# Patient Record
Sex: Male | Born: 1955
Health system: Southern US, Community
[De-identification: ages and names within clinical notes are randomized; demographics above are authoritative.]

## PROBLEM LIST (undated history)

## (undated) DIAGNOSIS — E119 Type 2 diabetes mellitus without complications: Secondary | ICD-10-CM

## (undated) DIAGNOSIS — M170 Bilateral primary osteoarthritis of knee: Secondary | ICD-10-CM

## (undated) DIAGNOSIS — E785 Hyperlipidemia, unspecified: Secondary | ICD-10-CM

## (undated) DIAGNOSIS — I10 Essential (primary) hypertension: Secondary | ICD-10-CM

## (undated) HISTORY — DX: Bilateral primary osteoarthritis of knee: M17.0

## (undated) HISTORY — DX: Essential (primary) hypertension: I10

## (undated) HISTORY — DX: Hyperlipidemia, unspecified: E78.5

## (undated) HISTORY — PX: KNEE SURGERY: SHX244

## (undated) HISTORY — PX: COLONOSCOPY: SHX5424

## (undated) HISTORY — DX: Type 2 diabetes mellitus without complications: E11.9

---

## 2001-04-06 ENCOUNTER — Inpatient Hospital Stay (HOSPITAL_COMMUNITY): Admission: EM | Admit: 2001-04-06 | Discharge: 2001-04-07 | Payer: Self-pay | Admitting: Emergency Medicine

## 2001-04-06 ENCOUNTER — Encounter: Payer: Self-pay | Admitting: Emergency Medicine

## 2013-09-26 ENCOUNTER — Encounter: Payer: Self-pay | Admitting: Sports Medicine

## 2013-09-26 ENCOUNTER — Ambulatory Visit (INDEPENDENT_AMBULATORY_CARE_PROVIDER_SITE_OTHER): Payer: BC Managed Care – PPO | Admitting: Sports Medicine

## 2013-09-26 VITALS — BP 123/83 | HR 76 | Ht 70.0 in | Wt 203.0 lb

## 2013-09-26 DIAGNOSIS — M1710 Unilateral primary osteoarthritis, unspecified knee: Secondary | ICD-10-CM

## 2013-09-26 DIAGNOSIS — M170 Bilateral primary osteoarthritis of knee: Secondary | ICD-10-CM

## 2013-09-26 DIAGNOSIS — IMO0002 Reserved for concepts with insufficient information to code with codable children: Secondary | ICD-10-CM

## 2013-09-26 HISTORY — DX: Bilateral primary osteoarthritis of knee: M17.0

## 2013-09-26 NOTE — Assessment & Plan Note (Signed)
We placed him in green soles insoles with arch support to help give stability below the knee to help decrease stress in the medial joint line. In addition they made recommendations for hip abduction strengthening as well as calf strengthening exercises. He'll use the body helix compression sleeve on the left. We're waiting the availability of the open patella compression sleeves for his right knee. Anti-inflammatories as needed. Followup as needed. Have recommended exercise bike or bicycle for exercise.

## 2013-09-26 NOTE — Progress Notes (Signed)
Patient ID: Ricardo Mccormick, Ricardo Mccormick   DOB: Feb 13, 1956, 58 y.o.   MRN: [id number] 58 year old Ricardo Mccormick presents with complaint of right knee pain for approximately one year. Pain is worse when walking. He's walk approximately 2 miles now he is limited to about a half mile before he has pain. Describes the pain on the anterolateral and superior portions of his knee. No specific injury or inciting event. He does have mild pain and discomfort the left knee as well however, he is a remote knee reconstruction of that knee. He is taking intermittent ibuprofen with only minimal relief.  Pertinent past medical history: Knee reconstruction in 1980 or 1981, hypertension  Social history: Nonsmoker,  Occasional alcohol  Review of systems as per history of present illness otherwise all systems negative  Examination: BP 123/83  Pulse 76  Ht 5\' 10"  (1.778 m)  Wt 203 lb (92.08 kg)  BMI 29.13 kg/m2 Well-developed well-nourished 58 year old Ricardo Mccormick awake alert and oriented in no acute distress ATNC, EOMI Normal respiratory effort  Knees: Mild effusion bilaterally with surgical scar on the left knee. Palpation normal with no warmth or joint line tenderness or patellar tenderness or condyle tenderness. ROM normal in flexion and extension and lower leg rotation. Ligaments with no laxity to varus or valgus stress Negative Mcmurray's and provocative meniscal tests. Pain reproduced with patellar compression  right greater than left Positive Clarks test right greater than left Patellar and quadriceps tendons unremarkable. Hamstring and quadriceps strength is normal.  Muscle skeletal ultrasound of the right knee reveals a suprapatellar pouch effusion.  Evidence of a degenerative meniscus medially.  Loss of articular cartilage in the medial portion of the patellofemoral groove.  Bone spurs and joint space narrowing in the medial joint line.

## 2013-11-07 ENCOUNTER — Ambulatory Visit: Payer: BC Managed Care – PPO | Admitting: Sports Medicine

## 2013-12-04 ENCOUNTER — Encounter: Payer: Self-pay | Admitting: Sports Medicine

## 2013-12-04 ENCOUNTER — Ambulatory Visit (INDEPENDENT_AMBULATORY_CARE_PROVIDER_SITE_OTHER): Payer: BC Managed Care – PPO | Admitting: Sports Medicine

## 2013-12-04 VITALS — BP 108/78 | HR 78 | Ht 70.0 in | Wt 200.0 lb

## 2013-12-04 DIAGNOSIS — IMO0002 Reserved for concepts with insufficient information to code with codable children: Secondary | ICD-10-CM

## 2013-12-04 DIAGNOSIS — M170 Bilateral primary osteoarthritis of knee: Secondary | ICD-10-CM

## 2013-12-04 DIAGNOSIS — M1710 Unilateral primary osteoarthritis, unspecified knee: Secondary | ICD-10-CM

## 2013-12-04 NOTE — Assessment & Plan Note (Signed)
Trial w open patella compression sleeve for walking  Lateral heel wedge on RT  Use insoles in shoes  Start bike rehab program  Cont Ibuprofen  Reck 3 mos

## 2013-12-04 NOTE — Progress Notes (Signed)
Patient ID: Ricardo Mccormick, Ricardo Mccormick   DOB: 03-27-1956, 58 y.o.   MRN: [id number]  Patient returns for F/U of bilat knee DJD Insoles helped Knee pain on RT begins within 800 yds of walking Now able to go about 1 mile Still with moderate swelling  Has not tried compression sleeve yet He gets some relief if he takes 600 ibuprofen  Has not started biking program yet  PEXAM NAD, muscular Ricardo Mccormick BP 108/78  Pulse 78  Ht 5\' 10"  (1.778 m)  Wt 200 lb (90.719 kg)  BMI 28.70 kg/m2   RT Severe DJD changes on RT with spurring along medial border Some TTP along sup lat border of patella Crepitation of patella Extension almost full Flex to 130 deg but pain at 120  LT knee Large surg scar  Chronic DJD as noted before  Gait shows rapid valgus thrust on RT  Correction w compression sleeve and lateral wedge helps lessen this

## 2013-12-11 ENCOUNTER — Ambulatory Visit: Payer: BC Managed Care – PPO | Admitting: Sports Medicine

## 2014-01-15 ENCOUNTER — Ambulatory Visit: Payer: BC Managed Care – PPO | Admitting: Sports Medicine

## 2014-04-21 ENCOUNTER — Ambulatory Visit
Admission: RE | Admit: 2014-04-21 | Discharge: 2014-04-21 | Disposition: A | Discharge status: Home / Self Care | Payer: BC Managed Care – PPO | Source: Ambulatory Visit | Attending: Family Medicine | Admitting: Family Medicine

## 2014-04-21 ENCOUNTER — Other Ambulatory Visit: Payer: Self-pay | Admitting: Family Medicine

## 2014-04-21 DIAGNOSIS — M2550 Pain in unspecified joint: Secondary | ICD-10-CM

## 2015-07-10 ENCOUNTER — Ambulatory Visit (INDEPENDENT_AMBULATORY_CARE_PROVIDER_SITE_OTHER): Payer: BLUE CROSS/BLUE SHIELD | Admitting: Cardiology

## 2015-07-10 ENCOUNTER — Ambulatory Visit (INDEPENDENT_AMBULATORY_CARE_PROVIDER_SITE_OTHER)
Admission: RE | Admit: 2015-07-10 | Discharge: 2015-07-10 | Disposition: A | Discharge status: Home / Self Care | Payer: Self-pay | Source: Ambulatory Visit | Attending: Cardiovascular Disease | Admitting: Cardiovascular Disease

## 2015-07-10 VITALS — BP 114/68 | HR 98 | Ht 70.0 in | Wt 216.2 lb

## 2015-07-10 DIAGNOSIS — E785 Hyperlipidemia, unspecified: Secondary | ICD-10-CM | POA: Diagnosis not present

## 2015-07-10 DIAGNOSIS — R079 Chest pain, unspecified: Secondary | ICD-10-CM

## 2015-07-10 DIAGNOSIS — R0602 Shortness of breath: Secondary | ICD-10-CM

## 2015-07-10 DIAGNOSIS — I10 Essential (primary) hypertension: Secondary | ICD-10-CM | POA: Diagnosis not present

## 2015-07-10 DIAGNOSIS — Z8249 Family history of ischemic heart disease and other diseases of the circulatory system: Secondary | ICD-10-CM

## 2015-07-10 DIAGNOSIS — I2510 Atherosclerotic heart disease of native coronary artery without angina pectoris: Secondary | ICD-10-CM

## 2015-07-10 NOTE — Addendum Note (Signed)
Addended by: Burnetta Sabin on: 07/10/2015 01:52 PM   Modules accepted: Orders

## 2015-07-10 NOTE — Progress Notes (Addendum)
Cardiology Office Note  NEW PATIENT   Date:  07/10/2015   ID:  Ricardo Mccormick, DOB [birth year], MRN [id number]  PCP:  Darrow Bussing, MD  Cardiologist:  NEW Dr. Eden Emms    Chief Complaint  Patient presents with  . Chest Pain  . Shortness of Breath      History of Present Illness: Ricardo Mccormick is a 60 y.o. male who presents for  Chest heaviness.  Pt with hx of  HTN, DM hyperlipidemia and family hx premature CAD- with father MI at 52,  with  was seen 07/07/15 for cough cold and chest tightness and placed on steroids.  With the chest pain troponin was neg. And EKG SR and no acute changes.  Pt placed on claritin and mucinex. Referred to cardiology.     Today pain has resolved-only was present the first day.  Denies any further chest pain or SOB.  No palpitations.  Also being evaluated for Lupus by [facility] Rheumatology Dr. Lucienne Capers.     Past Medical History  Diagnosis Date  . Arthritis of both knees 09/26/2013    Patellofemoral chondromalacia and medial joint line arthritis of the right knee. Posttraumatic arthritis of the left knee   . HTN (hypertension)   . HLD (hyperlipidemia)   . Diabetes mellitus without complication [facility])     Past Surgical History  Procedure Laterality Date  . Colonoscopy    . Knee surgery Left     1980 &1992     Current Outpatient Prescriptions  Medication Sig Dispense Refill  . Ascorbic Acid (VITAMIN C) 1000 MG tablet Take 1,000 mg by mouth daily.    Marland Kitchen aspirin 81 MG tablet Take 81 mg by mouth daily.    Marland Kitchen atorvastatin (LIPITOR) 40 MG tablet Take 40 mg by mouth daily.    Marland Kitchen dextromethorphan-guaiFENesin (MUCINEX DM) 30-600 MG 12hr tablet Take 1 tablet by mouth 2 (two) times daily.    Marland Kitchen lisinopril (PRINIVIL,ZESTRIL) 10 MG tablet Take 10 mg by mouth daily.    . Loratadine-Pseudoephedrine (CLARITIN-D 24 HOUR PO) Take 1 tablet by mouth daily.    . Omega-3 Fatty Acids (FISH OIL) 1200 MG CAPS Take 1 capsule by mouth daily.     Marland Kitchen omeprazole (PRILOSEC) 20 MG  capsule Take 20 mg by mouth. One tab every third day    . predniSONE (DELTASONE) 20 MG tablet Take 20 mg by mouth as directed.  0   No current facility-administered medications for this visit.    Allergies:   Review of patient's allergies indicates no known allergies.    Social History:  The patient  reports that he has never smoked. He does not have any smokeless tobacco history on file. He reports that he drinks about 0.6 oz of alcohol per week.   Family History:  The patient's family history includes Alzheimer's disease in his mother; Cancer in his mother; Diabetes in his maternal grandfather and sister; Epilepsy in his sister; Heart attack in his father.    ROS:  General:no colds or fevers, no weight changes Skin:no rashes or ulcers HEENT:no blurred vision, no congestion CV:see HPI PUL:see HPI GI:no diarrhea constipation or melena, no indigestion GU:no hematuria, no dysuria MS:no joint pain, no claudication Neuro:no syncope, no lightheadedness Endo:no diabetes, no thyroid disease  Wt Readings from Last 3 Encounters:  07/10/15 216 lb 3.2 oz (98.068 kg)  12/04/13 200 lb (90.719 kg)  09/26/13 203 lb (92.08 kg)     PHYSICAL EXAM: VS:  BP 114/68 mmHg  Pulse 98  Ht  (1.778 m)  Wt 216 lb 3.2 oz (98.068 kg)  BMI 31.02 kg/m2 , BMI Body mass index is 31.02 kg/(m^2). General:Pleasant affect, NAD Skin:Warm and dry, brisk capillary refill HEENT:normocephalic, sclera clear, mucus membranes moist Neck:supple, no JVD, no bruits  Heart:S1S2 RRR without murmur, gallup, rub or click Lungs:clear without rales, rhonchi, or wheezes ZOX:WRUE, non tender, + BS, do not palpate liver spleen or masses Ext:no lower ext edema, 2+ pedal pulses, 2+ radial pulses Neuro:alert and oriented, MAE, follows commands, + facial symmetry    EKG:  EKG is ordered today. The ekg ordered today demonstrates SR possible Lt atrial enlargement.  No changes.    Recent Labs:07/07/15 Na140, K+ 4.3 LFTs  were normal Cr 1.29  Bun 16 CPK 109 troponin<0.01  Lipid Panel 04/29/15  tg 127, tCHOL 147 hdl 45  LDL 76  Other studies Reviewed: Additional studies/ records that were reviewed today include: previous office notes. .   ASSESSMENT AND PLAN:  1.  Chest pain with cold symptoms now resolved.  Pt asymptomatic but with multiple risk factors should have eval.  I discussed with Dr. Eden Emms and then with pt plan to do cardiac CT calcium score.  This will give 5 year risk for cardiac disease.  He will then follow with Dr. Eden Emms for results and any further recommendations.    2. HTN  Controlled  3. Hyperlipidemia treated  With LDL 76  4. Premature family hx CAD.  ADDENDUM:  PLEASE NOT CA+SCORE WAS 1070.  DR. Eden Emms READ IT AND WE WILL PROCEED WITH EXERCISE MYOVIEW.  I CALLED THE PT  AND GAVE THE RESULTS AND THAT HE DID HAVE 3 VESSEL CAD.  PT AWARE AND WILL PROCEED WITH THE TEST.   Current medicines are reviewed with the patient today.  The patient Has no concerns regarding medicines.  The following changes have been made:  See above Labs/ tests ordered today include:see above  Disposition:   FU:  see above  Nyoka Lint, NP  07/10/2015 8:13 AM    [facility] [address] [place], [place], [place]  [address]/ [address] [place], [place] Phone: [phone]; Fax: [phone]  [phone]

## 2015-07-10 NOTE — Patient Instructions (Addendum)
Medication Instructions:  Your physician recommends that you continue on your current medications as directed. Please refer to the Current Medication list given to you today.   Labwork: None ordered  Testing/Procedures: Your physician has requested that you have cardiac CT with Calcium Scoring.  Cardiac computed tomography (CT) is a painless test that uses an x-ray machine to take clear, detailed pictures of your heart. For further information please visit [email or web address] Please follow instruction sheet as given.     Follow-Up: Your physician recommends that you schedule a follow-up appointment in: 1ST AVAILABLE WITH DR. Eden Emms   Any Other Special Instructions Will Be Listed Below (If Applicable). Coronary Calcium Scan A coronary calcium scan is an imaging test used to look for deposits of calcium and other fatty materials (plaques) in the inner lining of the blood vessels of your heart (coronary arteries). These deposits of calcium and plaques can partly clog and narrow the coronary arteries without producing any symptoms or warning signs. This puts you at risk for a heart attack. This test can detect these deposits before symptoms develop.  LET [facility] CARE PROVIDER KNOW ABOUT:  Any allergies you have.  All medicines you are taking, including vitamins, herbs, eye drops, creams, and over-the-counter medicines.  Previous problems you or members of your family have had with the use of anesthetics.  Any blood disorders you have.  Previous surgeries you have had.  Medical conditions you have.  Possibility of pregnancy, if this applies. RISKS AND COMPLICATIONS Generally, this is a safe procedure. However, as with any procedure, complications can occur. This test involves the use of radiation. Radiation exposure can be dangerous to a pregnant woman and her unborn baby. If you are pregnant, you should not have this procedure done.  BEFORE THE PROCEDURE There is no special  preparation for the procedure. PROCEDURE  You will need to undress and put on a hospital gown. You will need to remove any jewelry around your neck or chest.  Sticky electrodes are placed on your chest and are connected to an electrocardiogram (EKG or electrocardiography) machine to recorda tracing of the electrical activity of your heart.  A CT scanner will take pictures of your heart. During this time, you will be asked to lie still and hold your breath for 2-3 seconds while a picture is being taken of your heart. AFTER THE PROCEDURE   You will be allowed to get dressed.  You can return to your normal activities after the scan is done.   This information is not intended to replace advice given to you by your health care provider. Make sure you discuss any questions you have with your health care provider.   Document Released: 10/29/2007 Document Revised: 05/07/2013 Document Reviewed: 01/07/2013 Elsevier Interactive Patient Education [employer]     If you need a refill on your cardiac medications before your next appointment, please call your pharmacy.

## 2015-07-20 ENCOUNTER — Telehealth (HOSPITAL_COMMUNITY): Payer: Self-pay | Admitting: *Deleted

## 2015-07-20 NOTE — Telephone Encounter (Signed)
Patient given detailed instructions per Myocardial Perfusion Study Information Sheet for the test on 07/22/15. Patient notified to arrive 15 minutes early and that it is imperative to arrive on time for appointment to keep from having the test rescheduled.  If you need to cancel or reschedule your appointment, please call the office within 24 hours of your appointment. Failure to do so may result in a cancellation of your appointment, and a $50 no show fee. Patient verbalized understanding. Danie Hannig J Toddrick Sanna, RN  

## 2015-07-22 ENCOUNTER — Ambulatory Visit (HOSPITAL_COMMUNITY): Payer: BLUE CROSS/BLUE SHIELD | Attending: Cardiology

## 2015-07-22 DIAGNOSIS — R079 Chest pain, unspecified: Secondary | ICD-10-CM | POA: Diagnosis not present

## 2015-07-22 DIAGNOSIS — R0602 Shortness of breath: Secondary | ICD-10-CM | POA: Insufficient documentation

## 2015-07-22 DIAGNOSIS — I10 Essential (primary) hypertension: Secondary | ICD-10-CM | POA: Diagnosis not present

## 2015-07-22 DIAGNOSIS — I2510 Atherosclerotic heart disease of native coronary artery without angina pectoris: Secondary | ICD-10-CM

## 2015-07-22 LAB — MYOCARDIAL PERFUSION IMAGING
Estimated workload: 8.5 METS
Exercise duration (min): 7 min
Exercise duration (sec): 0 s
LV dias vol: 104 mL (ref 62–150)
LV sys vol: 35 mL
MPHR: 162 {beats}/min
Peak HR: 151 {beats}/min
Percent HR: 93 %
RATE: 0.36
RPE: 18
Rest HR: 74 {beats}/min
SDS: 1
SRS: 0
SSS: 1
TID: 0.79

## 2015-07-22 MED ORDER — TECHNETIUM TC 99M SESTAMIBI GENERIC - CARDIOLITE
32.5000 | Freq: Once | INTRAVENOUS | Status: AC | PRN
  Administered 2015-07-22: 33 via INTRAVENOUS

## 2015-07-22 MED ORDER — TECHNETIUM TC 99M SESTAMIBI GENERIC - CARDIOLITE
10.3000 | Freq: Once | INTRAVENOUS | Status: AC | PRN
  Administered 2015-07-22: 10 via INTRAVENOUS

## 2015-07-23 NOTE — Progress Notes (Signed)
Patient ID: Joncarlo Friberg, male   DOB: Sep 24, 1955, 60 y.o.   MRN: [id number]    Cardiology Office Note  NEW PATIENT   Date:  07/28/2015   ID:  Virgina Evener, DOB 1956-05-07, MRN [id number]  PCP:  Darrow Bussing, MD  Cardiologist:  NEW Dr. Eden Emms    Chief Complaint  Patient presents with  . Shortness of Breath    no curent SOB sx      History of Present Illness: Dominick Morella is a 60 y.o. male who was seen by PA 2/27  for  Chest heaviness.  Pt with hx of  HTN, DM hyperlipidemia and family hx premature CAD- with father MI at 45,  with  was seen 07/07/15 for cough cold and chest tightness and placed on steroids.  With the chest pain troponin was neg. And EKG SR and no acute changes.  Pt placed on claritin and mucinex. Referred to cardiology.      Denies any further chest pain or SOB.  No palpitations.  Also being evaluated for Lupus by [facility] Rheumatology Dr. Lucienne Capers.    07/10/15 Calcium score 1070  With calcium in all 3 major arteries   F/U perfusion study done 07/22/15 normal with no ischemia EF 60%   He is taking aspirin and statin   No chest pain.  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   Past Medical History  Diagnosis Date  . Arthritis of both knees 09/26/2013    Patellofemoral chondromalacia and medial joint line arthritis of the right knee. Posttraumatic arthritis of the left knee   . HTN (hypertension)   . HLD (hyperlipidemia)   . Diabetes mellitus without complication [facility])     Past Surgical History  Procedure Laterality Date  . Colonoscopy    . Knee surgery Left     1980 &1992     Current Outpatient Prescriptions  Medication Sig Dispense Refill  . Ascorbic Acid (VITAMIN C) 1000 MG tablet Take 1,000 mg by mouth daily.    Marland Kitchen aspirin 81 MG tablet Take 81 mg by mouth daily.    Marland Kitchen atorvastatin (LIPITOR) 40 MG tablet Take 40 mg by mouth daily.    . hydroxychloroquine (PLAQUENIL) 200 MG tablet Take 1 tablet by mouth daily.  1  . lisinopril (PRINIVIL,ZESTRIL) 10  MG tablet Take 10 mg by mouth daily.    . Omega-3 Fatty Acids (FISH OIL) 1200 MG CAPS Take 1 capsule by mouth daily.     Marland Kitchen omeprazole (PRILOSEC) 20 MG capsule Take 20 mg by mouth every 3 (three) days.     . predniSONE (DELTASONE) 20 MG tablet Take 20 mg by mouth as directed.  0   No current facility-administered medications for this visit.    Allergies:   Review of patient's allergies indicates no known allergies.    Social History:  The patient  reports that he has never smoked. He does not have any smokeless tobacco history on file. He reports that he drinks about 0.6 oz of alcohol per week.   Family History:  The patient's family history includes Alzheimer's disease in his mother; Cancer in his mother; Diabetes in his maternal grandfather and sister; Epilepsy in his sister; Heart attack in his father.    ROS:  General:no colds or fevers, no weight changes Skin:no rashes or ulcers HEENT:no blurred vision, no congestion CV:see HPI PUL:see HPI GI:no diarrhea constipation or melena, no indigestion GU:no hematuria, no dysuria MS:no joint pain, no claudication Neuro:no syncope, no lightheadedness Endo:no diabetes, no  thyroid disease  Wt Readings from Last 3 Encounters:  07/28/15 98.34 kg (216 lb 12.8 oz)  07/22/15 97.977 kg (216 lb)  07/10/15 98.068 kg (216 lb 3.2 oz)     PHYSICAL EXAM: VS:  BP 132/84 mmHg  Pulse 97  Ht 5\' 10"  (1.778 m)  Wt 98.34 kg (216 lb 12.8 oz)  BMI 31.11 kg/m2  SpO2 95% , BMI Body mass index is 31.11 kg/(m^2). General:Pleasant affect, NAD Skin:Warm and dry, brisk capillary refill HEENT:normocephalic, sclera clear, mucus membranes moist Neck:supple, no JVD, no bruits  Heart:S1S2 RRR without murmur, gallup, rub or click Lungs:clear without rales, rhonchi, or wheezes NUU:VOZDAbd:soft, non tender, + BS, do not palpate liver spleen or masses Ext:no lower ext edema, 2+ pedal pulses, 2+ radial pulses Neuro:alert and oriented, MAE, follows commands, + facial  symmetry    EKG:    07/10/15  SR possible Lt atrial enlargement.  No changes.    Recent Labs:07/07/15 Na140, K+ 4.3 LFTs were normal Cr 1.29  Bun 16 CPK 109 troponin<0.01  Lipid Panel 04/29/15  tg 127, tCHOL 147 hdl 45  LDL 76  Other studies Reviewed: Additional studies/ records that were reviewed today include: previous office notes. .   ASSESSMENT AND PLAN:  1.  Chest pain with cold symptoms now resolved.   Normal perfusion study.  High calcium score on statin and asa  2. HTN  Controlled  3. Hyperlipidemia treated  With LDL 76 continue lipitor   4. Premature family hx CAD.  Will need ETT in 2 years     Charlton Hawseter Dalaysia Harms

## 2015-07-24 ENCOUNTER — Encounter: Payer: Self-pay | Admitting: Cardiovascular Disease

## 2015-07-28 ENCOUNTER — Ambulatory Visit (INDEPENDENT_AMBULATORY_CARE_PROVIDER_SITE_OTHER): Payer: BLUE CROSS/BLUE SHIELD | Admitting: Cardiovascular Disease

## 2015-07-28 ENCOUNTER — Encounter: Payer: Self-pay | Admitting: Cardiovascular Disease

## 2015-07-28 VITALS — BP 132/84 | HR 97 | Ht 70.0 in | Wt 216.8 lb

## 2015-07-28 DIAGNOSIS — R0602 Shortness of breath: Secondary | ICD-10-CM

## 2015-07-28 NOTE — Patient Instructions (Addendum)

## 2015-09-30 DIAGNOSIS — N183 Chronic kidney disease, stage 3 (moderate): Secondary | ICD-10-CM | POA: Diagnosis not present

## 2015-09-30 DIAGNOSIS — R7989 Other specified abnormal findings of blood chemistry: Secondary | ICD-10-CM | POA: Diagnosis not present

## 2015-09-30 DIAGNOSIS — M064 Inflammatory polyarthropathy: Secondary | ICD-10-CM | POA: Diagnosis not present

## 2015-09-30 DIAGNOSIS — R768 Other specified abnormal immunological findings in serum: Secondary | ICD-10-CM | POA: Diagnosis not present

## 2015-10-05 DIAGNOSIS — H524 Presbyopia: Secondary | ICD-10-CM | POA: Diagnosis not present

## 2015-10-05 DIAGNOSIS — Z1283 Encounter for screening for malignant neoplasm of skin: Secondary | ICD-10-CM | POA: Diagnosis not present

## 2015-10-05 DIAGNOSIS — Z809 Family history of malignant neoplasm, unspecified: Secondary | ICD-10-CM | POA: Diagnosis not present

## 2015-10-05 DIAGNOSIS — L821 Other seborrheic keratosis: Secondary | ICD-10-CM | POA: Diagnosis not present

## 2015-10-05 DIAGNOSIS — L570 Actinic keratosis: Secondary | ICD-10-CM | POA: Diagnosis not present

## 2015-10-28 DIAGNOSIS — M25522 Pain in left elbow: Secondary | ICD-10-CM | POA: Diagnosis not present

## 2015-10-28 DIAGNOSIS — I10 Essential (primary) hypertension: Secondary | ICD-10-CM | POA: Diagnosis not present

## 2015-10-28 DIAGNOSIS — E119 Type 2 diabetes mellitus without complications: Secondary | ICD-10-CM | POA: Diagnosis not present

## 2015-10-28 DIAGNOSIS — M25561 Pain in right knee: Secondary | ICD-10-CM | POA: Diagnosis not present

## 2015-12-31 DIAGNOSIS — M064 Inflammatory polyarthropathy: Secondary | ICD-10-CM | POA: Diagnosis not present

## 2015-12-31 DIAGNOSIS — R768 Other specified abnormal immunological findings in serum: Secondary | ICD-10-CM | POA: Diagnosis not present

## 2015-12-31 DIAGNOSIS — R7989 Other specified abnormal findings of blood chemistry: Secondary | ICD-10-CM | POA: Diagnosis not present

## 2015-12-31 DIAGNOSIS — N183 Chronic kidney disease, stage 3 (moderate): Secondary | ICD-10-CM | POA: Diagnosis not present

## 2016-02-12 DIAGNOSIS — M170 Bilateral primary osteoarthritis of knee: Secondary | ICD-10-CM | POA: Diagnosis not present

## 2016-02-12 DIAGNOSIS — M1712 Unilateral primary osteoarthritis, left knee: Secondary | ICD-10-CM | POA: Diagnosis not present

## 2016-02-12 DIAGNOSIS — M1711 Unilateral primary osteoarthritis, right knee: Secondary | ICD-10-CM | POA: Diagnosis not present

## 2016-03-31 DIAGNOSIS — M25562 Pain in left knee: Secondary | ICD-10-CM | POA: Diagnosis not present

## 2016-03-31 DIAGNOSIS — M170 Bilateral primary osteoarthritis of knee: Secondary | ICD-10-CM | POA: Diagnosis not present

## 2016-03-31 DIAGNOSIS — G8929 Other chronic pain: Secondary | ICD-10-CM | POA: Diagnosis not present

## 2016-04-04 DIAGNOSIS — R768 Other specified abnormal immunological findings in serum: Secondary | ICD-10-CM | POA: Diagnosis not present

## 2016-04-04 DIAGNOSIS — N183 Chronic kidney disease, stage 3 (moderate): Secondary | ICD-10-CM | POA: Diagnosis not present

## 2016-04-04 DIAGNOSIS — M064 Inflammatory polyarthropathy: Secondary | ICD-10-CM | POA: Diagnosis not present

## 2016-04-04 DIAGNOSIS — R7989 Other specified abnormal findings of blood chemistry: Secondary | ICD-10-CM | POA: Diagnosis not present

## 2016-04-25 DIAGNOSIS — I10 Essential (primary) hypertension: Secondary | ICD-10-CM | POA: Diagnosis not present

## 2016-04-25 DIAGNOSIS — Z0000 Encounter for general adult medical examination without abnormal findings: Secondary | ICD-10-CM | POA: Diagnosis not present

## 2016-04-25 DIAGNOSIS — E119 Type 2 diabetes mellitus without complications: Secondary | ICD-10-CM | POA: Diagnosis not present

## 2016-04-25 DIAGNOSIS — Z125 Encounter for screening for malignant neoplasm of prostate: Secondary | ICD-10-CM | POA: Diagnosis not present

## 2016-04-25 DIAGNOSIS — E785 Hyperlipidemia, unspecified: Secondary | ICD-10-CM | POA: Diagnosis not present

## 2016-06-12 ENCOUNTER — Encounter (HOSPITAL_BASED_OUTPATIENT_CLINIC_OR_DEPARTMENT_OTHER): Payer: Self-pay | Admitting: Emergency Medicine

## 2016-06-12 ENCOUNTER — Emergency Department (HOSPITAL_BASED_OUTPATIENT_CLINIC_OR_DEPARTMENT_OTHER)
Admission: EM | Admit: 2016-06-12 | Discharge: 2016-06-12 | Disposition: A | Discharge status: Home / Self Care | Payer: BLUE CROSS/BLUE SHIELD | Attending: Emergency Medicine | Admitting: Emergency Medicine

## 2016-06-12 DIAGNOSIS — Z7982 Long term (current) use of aspirin: Secondary | ICD-10-CM | POA: Insufficient documentation

## 2016-06-12 DIAGNOSIS — E119 Type 2 diabetes mellitus without complications: Secondary | ICD-10-CM | POA: Insufficient documentation

## 2016-06-12 DIAGNOSIS — M5442 Lumbago with sciatica, left side: Secondary | ICD-10-CM | POA: Diagnosis not present

## 2016-06-12 DIAGNOSIS — I10 Essential (primary) hypertension: Secondary | ICD-10-CM | POA: Diagnosis not present

## 2016-06-12 DIAGNOSIS — M545 Low back pain: Secondary | ICD-10-CM | POA: Diagnosis present

## 2016-06-12 MED ORDER — IBUPROFEN 800 MG PO TABS: 800.0000 mg | ORAL_TABLET | Freq: Four times a day (QID) | ORAL | 0 refills | Status: AC

## 2016-06-12 NOTE — ED Triage Notes (Signed)
Patient states that he rolled over in bed  = reports that he has had pain to his left hip and down his leg since. Denies any trauma. The [facility] and was sent her for r/oDVT  - No swelling noted.

## 2016-06-12 NOTE — ED Provider Notes (Signed)
[facility] Provider Note   CSN: [id number] Arrival date & time: 06/12/16  1250     History   Chief Complaint Chief Complaint  Patient presents with  . Back Pain    HPI Ricardo Mccormick is a 61 y.o. male.  The history is provided by the patient.  Back Pain   This is a new problem. Episode onset: 4 days ago. The problem occurs constantly. The problem has not changed since onset.The pain is associated with twisting. The pain is present in the lumbar spine. The quality of the pain is described as stabbing and aching. The pain radiates to the left thigh. The pain is moderate. The symptoms are aggravated by certain positions. The pain is worse during the day. Pertinent negatives include no fever, no bowel incontinence, no bladder incontinence and no weakness. He has tried nothing for the symptoms. Risk factors include obesity and lack of exercise.    Past Medical History:  Diagnosis Date  . Arthritis of both knees 09/26/2013   Patellofemoral chondromalacia and medial joint line arthritis of the right knee. Posttraumatic arthritis of the left knee   . Diabetes mellitus without complication (HCC)   . HLD (hyperlipidemia)   . HTN (hypertension)     Patient Active Problem List   Diagnosis Date Noted  . Arthritis of both knees 09/26/2013    Past Surgical History:  Procedure Laterality Date  . COLONOSCOPY    . KNEE SURGERY Left    1980 &1992       Home Medications    Prior to Admission medications   Medication Sig Start Date End Date Taking? Authorizing Provider  Ascorbic Acid (VITAMIN C) 1000 MG tablet Take 1,000 mg by mouth daily.    Historical Provider, MD  aspirin 81 MG tablet Take 81 mg by mouth daily.    Historical Provider, MD  atorvastatin (LIPITOR) 40 MG tablet Take 40 mg by mouth daily.    Historical Provider, MD  hydroxychloroquine (PLAQUENIL) 200 MG tablet Take 1 tablet by mouth daily. 07/22/15   Historical Provider, MD  lisinopril (PRINIVIL,ZESTRIL) 10 MG  tablet Take 10 mg by mouth daily.    Historical Provider, MD  Omega-3 Fatty Acids (FISH OIL) 1200 MG CAPS Take 1 capsule by mouth daily.     Historical Provider, MD  omeprazole (PRILOSEC) 20 MG capsule Take 20 mg by mouth every 3 (three) days.     Historical Provider, MD  predniSONE (DELTASONE) 20 MG tablet Take 20 mg by mouth as directed. 07/07/15   Historical Provider, MD    Family History Family History  Problem Relation Age of Onset  . Heart attack Father   . Cancer Mother     skin,breast  . Alzheimer's disease Mother   . Epilepsy Sister   . Diabetes Sister   . Diabetes Maternal Grandfather     Social History Social History  Substance Use Topics  . Smoking status: Never Smoker  . Smokeless tobacco: Never Used  . Alcohol use 0.6 oz/week    1 Cans of beer per week     Allergies   Patient has no known allergies.   Review of Systems Review of Systems  Constitutional: Negative for fever.  Gastrointestinal: Negative for bowel incontinence.  Genitourinary: Negative for bladder incontinence.  Musculoskeletal: Positive for back pain.  Neurological: Negative for weakness.  All other systems reviewed and are negative.    Physical Exam Updated Vital Signs BP 128/94 (BP Location: Right Arm)   Pulse 99  Temp 98.6 F (37 C) (Oral)   Resp 18   Ht 5\' 10"  (1.778 m)   Wt 223 lb (101.2 kg)   SpO2 97%   BMI 32.00 kg/m   Physical Exam  Constitutional: He is oriented to person, place, and time. He appears well-developed and well-nourished. No distress.  HENT:  Head: Normocephalic and atraumatic.  Nose: Nose normal.  Eyes: Conjunctivae are normal.  Neck: Neck supple. No tracheal deviation present.  Cardiovascular: Normal rate and regular rhythm.   Pulmonary/Chest: Effort normal. No respiratory distress.  Abdominal: Soft. He exhibits no distension.  Musculoskeletal:       Lumbar back: He exhibits pain. He exhibits normal range of motion and no tenderness.        Back:  Left leg varicose veins, soft and compressible. No leg swelling. Well healed anterior knee scar. Positive leg raise on left.  Neurological: He is alert and oriented to person, place, and time.  Skin: Skin is warm and dry.  Psychiatric: He has a normal mood and affect.     ED Treatments / Results  Labs (all labs ordered are listed, but only abnormal results are displayed) Labs Reviewed - No data to display  EKG  EKG Interpretation None       Radiology No results found.  Procedures Procedures (including critical care time)  Medications Ordered in ED Medications - No data to display   Initial Impression / Assessment and Plan / ED Course  I have reviewed the triage vital signs and the nursing notes.  Pertinent labs & imaging results that were available during my care of the patient were reviewed by me and considered in my medical decision making (see chart for details).     61 y.o. male presents with back pain in lumbar area since 3 days ago with signs of radicular pain. No acute traumatic onset. No red flag symptoms of fever, weight loss, saddle anesthesia, weakness, fecal/urinary incontinence or urinary retention. Suspect MSK etiology. No swelling, Wells score for DVT is 0 and low clinical suspicion. Has diffuse varicose veins that are soft, compressible and resolved when Pt is seated. He states these have been here for years since his remote knee surgery on that side. No indication for imaging emergently. Patient was recommended to take short course of scheduled NSAIDs and engage in early mobility as definitive treatment. Return precautions discussed for worsening or new concerning symptoms.    Final Clinical Impressions(s) / ED Diagnoses   Final diagnoses:  Acute left-sided low back pain with left-sided sciatica    New Prescriptions Discharge Medication List as of 06/12/2016  3:33 PM       Lyndal Pulley, MD 06/13/16 [phone]

## 2016-06-12 NOTE — ED Notes (Signed)
Patient states he rolled over in bed on Thursday night and began having sudden onset left side low back pain, with pain that intermittently radiates to the knee, the pain is sometimes on his inner leg and sometimes on the outer leg. Patient is A&Ox4. Patient denies any injuries. Patient states the pain is currently 7/10, treated with ibuprofen at home. Patient [facility] and was sent here to rule out a DVT. Patient denies any SOB, denies difficulty breathing. Left calf is equal to right calf in size, but is slightly warmed to touch.

## 2016-06-17 DIAGNOSIS — M5432 Sciatica, left side: Secondary | ICD-10-CM | POA: Diagnosis not present

## 2016-07-01 ENCOUNTER — Ambulatory Visit (INDEPENDENT_AMBULATORY_CARE_PROVIDER_SITE_OTHER): Payer: BLUE CROSS/BLUE SHIELD | Admitting: Physical Therapy

## 2016-07-01 ENCOUNTER — Encounter: Payer: Self-pay | Admitting: Physical Therapy

## 2016-07-01 DIAGNOSIS — M62838 Other muscle spasm: Secondary | ICD-10-CM

## 2016-07-01 DIAGNOSIS — M6281 Muscle weakness (generalized): Secondary | ICD-10-CM | POA: Diagnosis not present

## 2016-07-01 DIAGNOSIS — M5442 Lumbago with sciatica, left side: Secondary | ICD-10-CM

## 2016-07-01 NOTE — Patient Instructions (Addendum)
   Use a firm ball and perform trigger point release to the left piriformis ( buttock) -lie on back, ball under buttocks, find tender spot and rest 30-45 sec  Quads / HF, Prone    Lie face down, knees together. Grasp one ankle with same-side hand. Use towel if needed to reach. Gently pull foot toward buttock. Hold _45__ seconds. Repeat _1__ times per session. Do _1__ sessions per day. Repeat on the other leg    Piriformis Stretch, Supine - can use towel, strap under right knee to help pull toward body.     Lie supine, left ankle crossed onto opposite knee. Holding bottom leg behind knee, gently pull legs toward chest until stretch is felt in buttock of top leg. Hold _45__ seconds. For deeper stretch gently push top knee away from body. Repeat __1_ times per session. Do ___ sessions per day.  Abdominal Bracing With Pelvic Floor (Hook-Lying) - can have hands in lower belly to feel for contraction, buzzing helps also.    With neutral spine, tighten pelvic floor and abdominals. Hold 5 seconds. Repeat __10_ times. Do _1__ times a day.  Hip External Rotation With Pillow: Transverse Plane Stability   One knee bent, one leg straight, on pillow. Slowly roll bent knee out. Be sure pelvis does not rotate. Do _10__ times. Restabilize pelvis. Repeat with other leg. Do _1-2__ sets, _1__ times per day.  [facility] [address] [place], [place] [address]  [phone] (office) [phone] (fax)

## 2016-07-01 NOTE — Therapy (Signed)
[facility] [place] [address] [place], [place], [address] Phone: [phone]   Fax:  [phone]  Physical Therapy Evaluation  Patient Details  Name: Ricardo Mccormick MRN: [id number] Date of Birth: [birth year] Referring Provider: Dr Carilyn Goodpasture Docia Chuck  Encounter Date: 07/01/2016      PT End of Session - 07/01/16 0807    Visit Number 1   Number of Visits 8   Date for PT Re-Evaluation 07/29/16   PT Start Time 0807   PT Stop Time 0853   PT Time Calculation (min) 46 min      Past Medical History:  Diagnosis Date  . Arthritis of both knees 09/26/2013   Patellofemoral chondromalacia and medial joint line arthritis of the right knee. Posttraumatic arthritis of the left knee   . Diabetes mellitus without complication (HCC)   . HLD (hyperlipidemia)   . HTN (hypertension)     Past Surgical History:  Procedure Laterality Date  . COLONOSCOPY    . KNEE SURGERY Left    1980 &1992    There were no vitals filed for this visit.       Subjective Assessment - 07/01/16 0810    Subjective Pt reports he rolled over in bed and had burning in the Lt low back and buttocks around 06/08/16.  Then two days later he had pain down the Lt LE. He reports having stiff back before however nothing like this, will see chiropracter and they would fix it, this time they told him to see MD for further assessment.    Pertinent History going to schedule bilat knee TKA sometime this year.    How long can you sit comfortably? varies 15-20 minutes   How long can you walk comfortably? 15-20 minutes   Diagnostic tests x-rays (-)    Patient Stated Goals get back to normal and get rid of pain   Currently in Pain? Yes   Pain Score 5   in AM 9/10   Pain Location Back   Pain Orientation Left   Pain Descriptors / Indicators Aching;Sharp;Shooting;Sore;Throbbing   Pain Type Acute pain   Pain Onset 1 to 4 weeks ago   Pain Frequency Constant   Aggravating Factors   random    Pain Relieving Factors change positions until he has relief            [facility] PT Assessment - 07/01/16 0001      Assessment   Medical Diagnosis Lt sided sciatica   Referring Provider Dr Carilyn Goodpasture Koirala   Onset Date/Surgical Date 06/08/16   Hand Dominance Right   Next MD Visit not scheduled   Prior Therapy not PT, only chiropracter     Precautions   Precaution Comments no heavy lifitng or pushing     Balance Screen   Has the patient fallen in the past 6 months No     Home Environment   Living Environment Private residence   Living Arrangements Spouse/significant other   Home Layout Two level  descend one stair at a time - baseline since knee surgery     Prior Function   Level of Independence Independent   Vocation Full time employment   [employer], mainly desk work   Leisure walk, some stetching     Functional Tests   Functional tests Squat;Single leg stance     Squat   Comments slight shift to Rt      Single Leg Stance   Comments bilat WNL     Posture/Postural  Control   Posture/Postural Control Postural limitations   Postural Limitations Decreased lumbar lordosis     ROM / Strength   AROM / PROM / Strength AROM;Strength     AROM   AROM Assessment Site Lumbar   Lumbar Flexion 2" from floor ( stomach stops him)    Lumbar Extension decreased 25% with Lt radicular pain   Lumbar - Right Side Bend WNl   Lumbar - Left Side Bend WNL   Lumbar - Right Rotation WNL   Lumbar - Left Rotation WNl     Strength   Strength Assessment Site Hip;Knee;Ankle;Lumbar   Right/Left Hip Right;Left   Right Hip Flexion 5/5   Right Hip Extension --  5-/5   Right Hip ABduction 5/5   Left Hip Flexion 5/5   Left Hip Extension --  5-/5   Left Hip ABduction 5/5   Right/Left Knee --  Rt WNL, Lt flex 5-/5, ext 4/5   Right/Left Ankle --  WNL except Lt PF & DF 4+/5   Lumbar Flexion --  TA poor   Lumbar Extension --  multifidi good      Flexibility    Soft Tissue Assessment /Muscle Length yes   Hamstrings supine SLR Lt 85, Rt 90   Quadriceps prone knee flex Lt 122, Rt 126     Palpation   Spinal mobility pain and hypomobility Lt L4 UPA articular pillar and transverse process   Palpation comment tight and tender in Lt piriformis                   [facility] Adult PT Treatment/Exercise - 07/01/16 0001      Self-Care   Self-Care Other Self-Care Comments   Other Self-Care Comments  trigger point release Lt piriformis with ball      Exercises   Exercises Lumbar     Lumbar Exercises: Stretches   Quad Stretch 1 rep  prone knee flex with strap   Piriformis Stretch 1 rep  45 sec for Lt      Lumbar Exercises: Supine   Ab Set 10 reps;5 seconds   Clam 10 reps  each side with TA contraction                PT Education - 07/01/16 0846    Education provided Yes   Education Details HEP    Person(s) Educated Patient   Methods Explanation;Handout;Demonstration   Comprehension Verbalized understanding;Returned demonstration             PT Long Term Goals - 07/01/16 0958      PT LONG TERM GOAL #1   Title I with advanced HEP (07/29/16)   Time 4   Period Weeks   Status New     PT LONG TERM GOAL #2   Title demo strong TA contraction and good stabilization of the pelvis with core exercise ( 07/29/16)    Time 4   Period Weeks   Status New     PT LONG TERM GOAL #3   Title increase strength Lt hip =/> 5/5 ( 07/29/16)    Time 4   Period Weeks     PT LONG TERM GOAL #4   Title report minimal to no Lt sciatica pain (07/29/16)    Time 4   Period Weeks   Status New               Plan - 07/01/16 16100854    Clinical Impression Statement 61 yo male presents for low complexity PT  eval for Lt sided sciatica.  He has some weakness in the Lt LE, tightness and tenderness in the Lt piriformis as well.  There is some weakness and tightness in the Lt quad.  He is going to be having bilat TKA's this year and needs to  get things resolved so he can heal quickly from this.    Rehab Potential Excellent   PT Frequency 2x / week   PT Duration 4 weeks   PT Treatment/Interventions Moist Heat;Ultrasound;Therapeutic exercise;Therapeutic activities;Dry needling;Manual techniques;Neuromuscular re-education;Cryotherapy;Electrical Stimulation;Iontophoresis 4mg /ml Dexamethasone;Patient/family education   PT Next Visit Plan manual work Lt piriformis, combo, core TEFL teacher and Agree with Plan of Care Patient      Patient will benefit from skilled therapeutic intervention in order to improve the following deficits and impairments:  Pain, Increased muscle spasms, Decreased strength, Impaired flexibility  Visit Diagnosis: Acute left-sided low back pain with left-sided sciatica - Plan: PT plan of care cert/re-cert  Muscle weakness (generalized) - Plan: PT plan of care cert/re-cert  Other muscle spasm - Plan: PT plan of care cert/re-cert     Problem List Patient Active Problem List   Diagnosis Date Noted  . Arthritis of both knees 09/26/2013    Roderic Scarce PT  07/01/2016, 10:03 AM  [facility] [address] [place], [place], [address] Phone: [phone]   Fax:  [phone]  Name: Domenick Quebedeaux MRN: [id number] Date of Birth: 1955/11/16

## 2016-07-05 ENCOUNTER — Ambulatory Visit (INDEPENDENT_AMBULATORY_CARE_PROVIDER_SITE_OTHER): Payer: BLUE CROSS/BLUE SHIELD | Admitting: Physical Therapy

## 2016-07-05 DIAGNOSIS — M5442 Lumbago with sciatica, left side: Secondary | ICD-10-CM | POA: Diagnosis not present

## 2016-07-05 DIAGNOSIS — M6281 Muscle weakness (generalized): Secondary | ICD-10-CM | POA: Diagnosis not present

## 2016-07-05 DIAGNOSIS — M62838 Other muscle spasm: Secondary | ICD-10-CM | POA: Diagnosis not present

## 2016-07-05 NOTE — Therapy (Signed)
[facility] [place] [address] [place], [place], [address] Phone: [phone]   Fax:  [phone]  Physical Therapy Treatment  Patient Details  Name: Ricardo Mccormick MRN: [id number] Date of Birth: January 24, 1956 Referring Provider: Dr. Darrow Bussing  Encounter Date: 07/05/2016      PT End of Session - 07/05/16 0810    Visit Number 2   Number of Visits 8   Date for PT Re-Evaluation 07/29/16   PT Start Time 0804   PT Stop Time 0858   PT Time Calculation (min) 54 min      Past Medical History:  Diagnosis Date  . Arthritis of both knees 09/26/2013   Patellofemoral chondromalacia and medial joint line arthritis of the right knee. Posttraumatic arthritis of the left knee   . Diabetes mellitus without complication (HCC)   . HLD (hyperlipidemia)   . HTN (hypertension)     Past Surgical History:  Procedure Laterality Date  . COLONOSCOPY    . KNEE SURGERY Left    1980 &1992    There were no vitals filed for this visit.      Subjective Assessment - 07/05/16 0810    Subjective Pt reports his symptoms are a little better.  Pain goes to back of Lt knee.  He is still having trouble sleeping through night.   Currently in Pain? Yes   Pain Score 4    Pain Location Back   Pain Orientation Left   Pain Descriptors / Indicators Constant;Dull   Aggravating Factors  sitting, standing or laying too long    Pain Relieving Factors changing positions             [facility] PT Assessment - 07/05/16 0001      Assessment   Medical Diagnosis Lt sided sciatica   Referring Provider Dr. Carilyn Goodpasture Koirala   Onset Date/Surgical Date 06/08/16   Hand Dominance Right   Next MD Visit not scheduled     Flexibility   Quadriceps prone knee flex 125 deg LLE.           [facility] Adult PT Treatment/Exercise - 07/05/16 0001      Lumbar Exercises: Stretches   Passive Hamstring Stretch 2 reps  45 sec, repeated 2 reps LLE in sitting.    Quad Stretch 2  reps;30 seconds  prone with strap, each leg    Piriformis Stretch 2 reps;30 seconds  each leg. Repeated 2 reps in sitting.      Lumbar Exercises: Aerobic   Stationary Bike NuStep L5: 6 min      Lumbar Exercises: Supine   Ab Set 5 reps;5 seconds   Clam 10 reps  each side with TA contraction   Bent Knee Raise 10 reps  each side with TA contraction   Bent Knee Raise Limitations Pt reports some mild buttock pain when lifting LLE.    Bridge 5 reps     Modalities   Modalities Moist Heat;Electrical Stimulation     Moist Heat Therapy   Number Minutes Moist Heat 15 Minutes   Moist Heat Location Lumbar Spine;Hip  Lt     Emergency planning/management officer IFC   Electrical Stimulation Parameters to tolerance   Electrical Stimulation Goals Pain;Tone     Manual Therapy   Manual Therapy Soft tissue mobilization   Manual therapy comments Pt prone   Soft tissue mobilization TPR to Lt piriformis, glute  PT Long Term Goals - 07/05/16 1058      PT LONG TERM GOAL #1   Title I with advanced HEP (07/29/16)   Time 4   Period Weeks   Status On-going     PT LONG TERM GOAL #2   Title demo strong TA contraction and good stabilization of the pelvis with core exercise ( 07/29/16)    Time 4   Period Weeks   Status On-going     PT LONG TERM GOAL #3   Title increase strength Lt hip =/> 5/5 ( 07/29/16)    Time 4   Period Weeks   Status On-going     PT LONG TERM GOAL #4   Title report minimal to no Lt sciatica pain (07/29/16)    Time 4   Period Weeks   Status On-going               Plan - 07/05/16 1052    Clinical Impression Statement Pt demonstrated strong TA contraction with minimal cues.  He was point tender in Lt glut med, piriformis with manual therapy.  Pain reduced with stretches and further reduction with use of estim/MHP at end of session.     Rehab Potential Excellent    PT Frequency 2x / week   PT Duration 4 weeks   PT Treatment/Interventions Moist Heat;Ultrasound;Therapeutic exercise;Therapeutic activities;Dry needling;Manual techniques;Neuromuscular re-education;Cryotherapy;Electrical Stimulation;Iontophoresis 4mg /ml Dexamethasone;Patient/family education   PT Next Visit Plan manual work Lt piriformis, core strengthening; modalities as indicated.    Consulted and Agree with Plan of Care Patient      Patient will benefit from skilled therapeutic intervention in order to improve the following deficits and impairments:  Pain, Increased muscle spasms, Decreased strength, Impaired flexibility  Visit Diagnosis: Acute left-sided low back pain with left-sided sciatica  Muscle weakness (generalized)  Other muscle spasm     Problem List Patient Active Problem List   Diagnosis Date Noted  . Arthritis of both knees 09/26/2013   Mayer CamelJennifer Carlson-Long, PTA 07/05/16 10:58 AM  [facility] [place] [facility] [address] [place], [place], [address] Phone: [phone]   Fax:  [phone]  Name: Ricardo Mccormick MRN: [id number] Date of Birth: 06-09-1955

## 2016-07-07 DIAGNOSIS — R7989 Other specified abnormal findings of blood chemistry: Secondary | ICD-10-CM | POA: Diagnosis not present

## 2016-07-07 DIAGNOSIS — M064 Inflammatory polyarthropathy: Secondary | ICD-10-CM | POA: Diagnosis not present

## 2016-07-07 DIAGNOSIS — N183 Chronic kidney disease, stage 3 (moderate): Secondary | ICD-10-CM | POA: Diagnosis not present

## 2016-07-07 DIAGNOSIS — R768 Other specified abnormal immunological findings in serum: Secondary | ICD-10-CM | POA: Diagnosis not present

## 2016-07-08 ENCOUNTER — Ambulatory Visit (INDEPENDENT_AMBULATORY_CARE_PROVIDER_SITE_OTHER): Payer: BLUE CROSS/BLUE SHIELD | Admitting: Physical Therapy

## 2016-07-08 DIAGNOSIS — M5442 Lumbago with sciatica, left side: Secondary | ICD-10-CM | POA: Diagnosis not present

## 2016-07-08 DIAGNOSIS — M62838 Other muscle spasm: Secondary | ICD-10-CM

## 2016-07-08 DIAGNOSIS — M6281 Muscle weakness (generalized): Secondary | ICD-10-CM

## 2016-07-08 NOTE — Patient Instructions (Addendum)
Arm / Leg Lift: Opposite (Prone)    Lift left leg and opposite arm ___2_ inches from floor, keeping knee locked. Repeat _10___ times per set. Do __2_ sets per session. Do __3__ sessions per week.   http://orth.[email or web address].us/[email or web address]    Outer Hip Stretch: Reclined IT Band Stretch (Strap)    Strap around opposite foot, pull across only as far as possible with shoulders on mat.  Keep bottom foot pointed towards ceiling.  You should feel a stretch along outer thigh.  Hold for __30__ sec . Repeat __2__ times each leg.  [facility] [address] [place], [place] [address]  [phone] (office) [phone] (fax)

## 2016-07-08 NOTE — Therapy (Signed)
[facility] [address] [place], [place], [address] Phone: [phone]   Fax:  [phone]  Physical Therapy Treatment  Patient Details  Name: Ricardo EvenerKurt Mccormick MRN: [id number] Date of Birth: November 30, 1955 Referring Provider: Dr. Darrow Bussingibas Koirala  Encounter Date: 07/08/2016      PT End of Session - 07/08/16 1151    Visit Number 3   Number of Visits 8   Date for PT Re-Evaluation 07/29/16   PT Start Time 1147   PT Stop Time 1246   PT Time Calculation (min) 59 min   Activity Tolerance Patient tolerated treatment well;No increased pain   Behavior During Therapy WFL for tasks assessed/performed      Past Medical History:  Diagnosis Date  . Arthritis of both knees 09/26/2013   Patellofemoral chondromalacia and medial joint line arthritis of the right knee. Posttraumatic arthritis of the left knee   . Diabetes mellitus without complication (HCC)   . HLD (hyperlipidemia)   . HTN (hypertension)     Past Surgical History:  Procedure Laterality Date  . COLONOSCOPY    . KNEE SURGERY Left    1980 &1992    There were no vitals filed for this visit.      Subjective Assessment - 07/08/16 1151    Subjective "It still feels tight in the morning (Lt back/buttock)".  He is taking less medicine now, 2 ibprofen in morning and evening.  He continues his stretches daily.    Currently in Pain? Yes   Pain Score 1    Pain Location Back   Pain Orientation Left   Pain Descriptors / Indicators Aching            [facility] PT Assessment - 07/08/16 0001      Assessment   Medical Diagnosis Lt sided sciatica   Onset Date/Surgical Date 06/08/16   Hand Dominance Right   Next MD Visit not scheduled   Prior Therapy not PT, only chiropracter          [facility] Adult PT Treatment/Exercise - 07/08/16 0001      Lumbar Exercises: Stretches   Passive Hamstring Stretch 2 reps  45 sec   Quad Stretch 2 reps;30 seconds  prone with strap, each leg     ITB Stretch 2 reps;30 seconds  2 reps each leg, supine with strap   Piriformis Stretch 2 reps;30 seconds  each leg     Lumbar Exercises: Aerobic   Stationary Bike NuStep L5: 5 min      Lumbar Exercises: Supine   Clam 10 reps  each side with TA contraction   Bent Knee Raise 15 reps  each side with TA contraction   Bridge 10 reps;5 seconds   Bridge Limitations then single leg bridges x 10 eac hleg     Lumbar Exercises: Prone   Straight Leg Raise 10 reps;2 seconds  each side   Opposite Arm/Leg Raise Right arm/Left leg;Left arm/Right leg;10 reps     Modalities   Modalities Moist Heat;Electrical Stimulation     Moist Heat Therapy   Number Minutes Moist Heat 15 Minutes   Moist Heat Location Lumbar Spine;Hip  Lt     Emergency planning/management officerlectrical Stimulation   Electrical Stimulation Location Lt glute/lumbar    Electrical Stimulation Action IFC   Electrical Stimulation Parameters to tolerance   Electrical Stimulation Goals Pain     Manual Therapy   Manual Therapy Soft tissue mobilization   Manual therapy comments Pt prone   Soft tissue mobilization TPR to  Lt lumbar paraspinals, piriformis, glute                 PT Education - 07/08/16 1237    Education provided Yes   Education Details HEP - added ITB stretch and prone opp arm/leg lift.    Person(s) Educated Patient   Methods Explanation;Handout   Comprehension Verbalized understanding             PT Long Term Goals - 07/05/16 1058      PT LONG TERM GOAL #1   Title I with advanced HEP (07/29/16)   Time 4   Period Weeks   Status On-going     PT LONG TERM GOAL #2   Title demo strong TA contraction and good stabilization of the pelvis with core exercise ( 07/29/16)    Time 4   Period Weeks   Status On-going     PT LONG TERM GOAL #3   Title increase strength Lt hip =/> 5/5 ( 07/29/16)    Time 4   Period Weeks   Status On-going     PT LONG TERM GOAL #4   Title report minimal to no Lt sciatica pain (07/29/16)    Time  4   Period Weeks   Status On-going               Plan - 07/08/16 1240    Clinical Impression Statement Pt reporting overall decrease in pain. He is gaining awareness of postures at work that agrivate his condition. He tolerated all exercises well, without increase in pain.  Progressing well towards goals.    Rehab Potential Excellent   PT Frequency 2x / week   PT Duration 4 weeks   PT Treatment/Interventions Moist Heat;Ultrasound;Therapeutic exercise;Therapeutic activities;Dry needling;Manual techniques;Neuromuscular re-education;Cryotherapy;Electrical Stimulation;Iontophoresis 4mg /ml Dexamethasone;Patient/family education   PT Next Visit Plan manual work Lt piriformis, core strengthening; modalities as indicated.    Consulted and Agree with Plan of Care Patient      Patient will benefit from skilled therapeutic intervention in order to improve the following deficits and impairments:  Pain, Increased muscle spasms, Decreased strength, Impaired flexibility  Visit Diagnosis: Acute left-sided low back pain with left-sided sciatica  Muscle weakness (generalized)  Other muscle spasm     Problem List Patient Active Problem List   Diagnosis Date Noted  . Arthritis of both knees 09/26/2013   Mayer Camel, PTA 07/08/16 12:41 PM  [facility] [place] [address] [place], [place], [address] Phone: [phone]   Fax:  [phone]  Name: Ricardo Mccormick MRN: [id number] Date of Birth: 09-27-1955

## 2016-07-12 ENCOUNTER — Ambulatory Visit (INDEPENDENT_AMBULATORY_CARE_PROVIDER_SITE_OTHER): Payer: BLUE CROSS/BLUE SHIELD | Admitting: Physical Therapy

## 2016-07-12 DIAGNOSIS — M6281 Muscle weakness (generalized): Secondary | ICD-10-CM | POA: Diagnosis not present

## 2016-07-12 DIAGNOSIS — M62838 Other muscle spasm: Secondary | ICD-10-CM | POA: Diagnosis not present

## 2016-07-12 DIAGNOSIS — M5442 Lumbago with sciatica, left side: Secondary | ICD-10-CM | POA: Diagnosis not present

## 2016-07-12 NOTE — Therapy (Signed)
[place] [address], [place], [address] Phone: [phone]   Fax:  [phone]  Physical Therapy Treatment  Patient Details  Name: Ricardo Mccormick MRN: [id number] Date of Birth: 04/29/1956 Referring Provider: Dr. Lujean Amel  Encounter Date: 07/12/2016      PT End of Session - 07/12/16 0809    Visit Number 4   Number of Visits 8   Date for PT Re-Evaluation 07/29/16   PT Start Time 0804   PT Stop Time 0900   PT Time Calculation (min) 56 min   Activity Tolerance Patient tolerated treatment well   Behavior During Therapy Ricardo Mccormick for tasks assessed/performed      Past Medical History:  Diagnosis Date  . Arthritis of both knees 09/26/2013   Patellofemoral chondromalacia and medial joint line arthritis of the right knee. Posttraumatic arthritis of the left knee   . Diabetes mellitus without complication (Ricardo Mccormick)   . HLD (hyperlipidemia)   . HTN (hypertension)     Past Surgical History:  Procedure Laterality Date  . COLONOSCOPY    . KNEE SURGERY Left    1980 &1992    There were no vitals filed for this visit.      Subjective Mccormick - 07/12/16 0810    Subjective Pt had a flare up this weekend with long drive to/from Ricardo Mccormick for game.  The next day his pain in back and front of Lt leg had increased (up to 8/10).     Currently in Pain? Yes   Pain Score 4    Pain Location Back   Pain Orientation Left   Pain Descriptors / Indicators Aching   Pain Radiating Towards radiating in front of Lt leg   Aggravating Factors  prolonged sitting   Pain Relieving Factors stretching.             Ricardo Mccormick - 07/12/16 0001      Mccormick   Medical Diagnosis Lt sided sciatica   Onset Date/Surgical Date 06/08/16   Hand Dominance Right   Next MD Visit not scheduled     Lumbar Flexion --  TA strong  contraction          [facility] Adult PT Treatment/Exercise - 07/12/16 0001      Lumbar Exercises:  Stretches   Passive Hamstring Stretch 2 reps  45 sec, supine with strap   Quad Stretch 2 reps;30 seconds  prone with strap, each leg    ITB Stretch 2 reps;30 seconds  2 reps each leg, supine with strap   Piriformis Stretch 2 reps;30 seconds  each leg     Lumbar Exercises: Aerobic   Stationary Bike NuStep L5: 5 min      Lumbar Exercises: Supine   Bent Knee Raise 10 reps;2 seconds   Bent Knee Raise Limitations then with opp arm lifts. Then on full foam roll with marching (arms for light support) x 10      Lumbar Exercises: Prone   Opposite Arm/Leg Raise Right arm/Left leg;Left arm/Right leg;10 reps  2nd set no pain   Opposite Arm/Leg Raise Limitations some symptoms with LLE ext, resolved with rest.      Moist Heat Therapy   Number Minutes Moist Heat 15 Minutes   Moist Heat Location Lumbar Spine;Hip  Lt     Theme park manager Lt glute/ lumbar   Electrical Stimulation Action IFC   Electrical Stimulation Parameters to tolerance    Electrical Stimulation Goals Pain  Manual Therapy   Manual Therapy Soft tissue mobilization   Manual therapy comments Pt prone   Soft tissue mobilization TPR to Lt lumbar paraspinals, piriformis, glute                      PT Long Term Goals - 07/12/16 0712      PT LONG TERM GOAL #1   Title I with advanced HEP (07/29/16)   Time 4   Period Weeks   Status On-going     PT LONG TERM GOAL #2   Title demo strong TA contraction and good stabilization of the pelvis with core exercise ( 07/29/16)    Time 4   Period Weeks   Status Achieved     PT LONG TERM GOAL #3   Title increase strength Lt hip =/> 5/5 ( 07/29/16)    Time 4   Period Weeks   Status On-going     PT LONG TERM GOAL #4   Title report minimal to no Lt sciatica pain (07/29/16)    Time 4   Period Weeks   Status On-going  varies dependent on amount of sitting.                Plan - 07/12/16 0845    Clinical Impression  Statement Pt reported increased Lt ant hip pain with initial prone hip ext; reduced with quad stretch.  No symptoms with hip ext after quad stretch. Pt tolerated all other exercises with reduction of symptoms.  Further reduction of symptoms with use of MHP/estim at end of session.  Pt has met LTG # 2 and making good gains towards remaining goals.    Rehab Potential Excellent   PT Frequency 2x / week   PT Duration 4 weeks   PT Treatment/Interventions Moist Heat;Ultrasound;Therapeutic exercise;Therapeutic activities;Dry needling;Manual techniques;Neuromuscular re-education;Cryotherapy;Electrical Stimulation;Iontophoresis 45m/ml Dexamethasone;Patient/family education   PT Next Visit Plan manual work Lt piriformis, core strengthening; modalities as indicated. Progress HEP.    Consulted and Agree with Plan of Care Patient      Patient will benefit from skilled therapeutic intervention in order to improve the following deficits and impairments:  Pain, Increased muscle spasms, Decreased strength, Impaired flexibility  Visit Diagnosis: Acute left-sided low back pain with left-sided sciatica  Muscle weakness (generalized)  Other muscle spasm     Problem List Patient Active Problem List   Diagnosis Date Noted  . Arthritis of both knees 09/26/2013   JKerin Perna PTA 07/12/16 12:55 PM  [address] [address]Phone: [phone]  Fax:  [phone] Name: Ricardo BeauchaineMRN: [id number] of Birth: 1September 29, 1957

## 2016-07-14 ENCOUNTER — Ambulatory Visit (INDEPENDENT_AMBULATORY_CARE_PROVIDER_SITE_OTHER): Payer: BLUE CROSS/BLUE SHIELD | Admitting: Rehabilitative and Restorative Service Providers"

## 2016-07-14 ENCOUNTER — Encounter: Payer: Self-pay | Admitting: Rehabilitative and Restorative Service Providers"

## 2016-07-14 DIAGNOSIS — M62838 Other muscle spasm: Secondary | ICD-10-CM | POA: Diagnosis not present

## 2016-07-14 DIAGNOSIS — M5442 Lumbago with sciatica, left side: Secondary | ICD-10-CM | POA: Diagnosis not present

## 2016-07-14 DIAGNOSIS — M6281 Muscle weakness (generalized): Secondary | ICD-10-CM

## 2016-07-14 NOTE — Patient Instructions (Signed)
Trunk: Prone Extension (Press-Ups)    Lie on stomach on firm, flat surface. Relax bottom and legs. Raise chest in air with elbows straight. Keep hips flat on surface, sag stomach. Hold __2-3__ seconds. Repeat __10__ times. Do ___2-3_ sessions per day. CAUTION: Movement should be gentle and slow.  NO PAIN!  Trunk Extension    Standing, place back of open hands on low back. Straighten spine then arch the back and move shoulders back. Repeat __2-3__ times per session. Do __several times per day - no pain   Standing stretch for the front of the hip - back of left hip against table or counter  Reach back with the right leg - hold 30 sec  Repeat with both legs  30 - 45 sec x 3 each   Ball release work front of the right hip   Modify the recliner/chairs to get out of the [place] C Keep the spine in neutral   Sleeping on Back  Place pillow under knees. A pillow with cervical support and a roll around waist are also helpful. Copyright  VHI. All rights reserved.  Sleeping on Side Place pillow between knees. Use cervical support under neck and a roll around waist as needed. Copyright  VHI. All rights reserved.   Sleeping on Stomach   If this is the only desirable sleeping position, place pillow under lower legs, and under stomach or chest as needed.  Posture - Sitting   Sit upright, head facing forward. Try using a roll to support lower back. Keep shoulders relaxed, and avoid rounded back. Keep hips level with knees. Avoid crossing legs for long periods. Stand to Sit / Sit to Stand   To sit: Bend knees to lower self onto front edge of chair, then scoot back on seat. To stand: Reverse sequence by placing one foot forward, and scoot to front of seat. Use rocking motion to stand up.   Work Height and Reach  Ideal work height is no more than 2 to 4 inches below elbow level when standing, and at elbow level when sitting. Reaching should be limited to arm's length, with elbows slightly  bent.  Bending  Bend at hips and knees, not back. Keep feet shoulder-width apart.    Posture - Standing   Good posture is important. Avoid slouching and forward head thrust. Maintain curve in low back and align ears over shoul- ders, hips over ankles.  Alternating Positions   Alternate tasks and change positions frequently to reduce fatigue and muscle tension. Take rest breaks. Computer Work   Position work to Art gallery managerface forward. Use proper work and seat height. Keep shoulders back and down, wrists straight, and elbows at right angles. Use chair that provides full back support. Add footrest and lumbar roll as needed.  Getting Into / Out of Car  Lower self onto seat, scoot back, then bring in one leg at a time. Reverse sequence to get out.  Dressing  Lie on back to pull socks or slacks over feet, or sit and bend leg while keeping back straight.    Housework - Sink  Place one foot on ledge of cabinet under sink when standing at sink for prolonged periods.   Pushing / Pulling  Pushing is preferable to pulling. Keep back in proper alignment, and use leg muscles to do the work.  Deep Squat   Squat and lift with both arms held against upper trunk. Tighten stomach muscles without holding breath. Use smooth movements to avoid jerking.  Avoid  Twisting   Avoid twisting or bending back. Pivot around using foot movements, and bend at knees if needed when reaching for articles.  Carrying Luggage   Distribute weight evenly on both sides. Use a cart whenever possible. Do not twist trunk. Move body as a unit.   Lifting Principles .Maintain proper posture and head alignment. .Slide object as close as possible before lifting. .Move obstacles out of the way. .Test before lifting; ask for help if too heavy. .Tighten stomach muscles without holding breath. .Use smooth movements; do not jerk. .Use legs to do the work, and pivot with feet. .Distribute the work load symmetrically and  close to the center of trunk. .Push instead of pull whenever possible.   Ask For Help   Ask for help and delegate to others when possible. Coordinate your movements when lifting together, and maintain the low back curve.  Log Roll   Lying on back, bend left knee and place left arm across chest. Roll all in one movement to the right. Reverse to roll to the left. Always move as one unit. Housework - Sweeping  Use long-handled equipment to avoid stooping.   Housework - Wiping  Position yourself as close as possible to reach work surface. Avoid straining your back.  Laundry - Unloading Wash   To unload small items at bottom of washer, lift leg opposite to arm being used to reach.  Gardening - Raking  Move close to area to be raked. Use arm movements to do the work. Keep back straight and avoid twisting.     Cart  When reaching into cart with one arm, lift opposite leg to keep back straight.   Getting Into / Out of Bed  Lower self to lie down on one side by raising legs and lowering head at the same time. Use arms to assist moving without twisting. Bend both knees to roll onto back if desired. To sit up, start from lying on side, and use same move-ments in reverse. Housework - Vacuuming  Hold the vacuum with arm held at side. Step back and forth to move it, keeping head up. Avoid twisting.   Laundry - Armed forces training and education officer so that bending and twisting can be avoided.   Laundry - Unloading Dryer  Squat down to reach into clothes dryer or use a reacher.  Gardening - Weeding / Psychiatric nurse or Kneel. Knee pads may be helpful.

## 2016-07-14 NOTE — Therapy (Signed)
[facility] [address] [place], [place], [address] Phone: [phone]   Fax:  [phone]  Physical Therapy Treatment  Patient Details  Name: Ricardo Mccormick MRN: [id number] Date of Birth: 07-17-1955 Referring Provider: Dr. Darrow Bussingibas Koirala  Encounter Date: 07/14/2016      PT End of Session - 07/14/16 1625    Visit Number 5   Number of Visits 8   Date for PT Re-Evaluation 07/29/16   PT Start Time 1617   PT Stop Time 1714   PT Time Calculation (min) 57 min   Activity Tolerance Patient tolerated treatment well      Past Medical History:  Diagnosis Date  . Arthritis of both knees 09/26/2013   Patellofemoral chondromalacia and medial joint line arthritis of the right knee. Posttraumatic arthritis of the left knee   . Diabetes mellitus without complication ([facility])   . HLD (hyperlipidemia)   . HTN (hypertension)     Past Surgical History:  Procedure Laterality Date  . COLONOSCOPY    . KNEE SURGERY Left    1980 &1992    There were no vitals filed for this visit.      Subjective Assessment - 07/14/16 1627    Subjective patient reports continued pain in the Lt LB and into the Lt groin and ant thigh worse in the am when he gets up    Currently in Pain? Yes   Pain Score 2    Pain Location Back   Pain Orientation Left   Pain Descriptors / Indicators Aching   Pain Radiating Towards ant Lt thigh    Pain Onset More than a month ago   Pain Frequency Intermittent                         [facility] Adult PT Treatment/Exercise - 07/14/16 0001      Self-Care   Self-Care --  education re back care/positioning      Therapeutic Activites    Therapeutic Activities --  ball release hip flexors Rt      Lumbar Exercises: Stretches   Standing Extension 3 reps  1-2 sec hold    Press Ups --  10 reps 2-3 sec hold    Quad Stretch Limitations hip flexor stretch standing hip at table extending LE 30 sec x 2 each LE       Lumbar Exercises: Aerobic   Stationary Bike NuStep L5: 5 min      Lumbar Exercises: Standing   Other Standing Lumbar Exercises backwards walking encouraging hip extension 40 ft x 4      Moist Heat Therapy   Number Minutes Moist Heat 20 Minutes   Moist Heat Location Lumbar Spine;Hip  Lt and Rt hip flexors      Electrical Stimulation   Electrical Stimulation Location Lt glute/ lumbar   Electrical Stimulation Action IFC   Electrical Stimulation Parameters  to toleranc   Electrical Stimulation Goals Pain;Tone     Manual Therapy   Manual therapy comments pt supine    Soft tissue mobilization deep tissue work through Rt hip flexors                 PT Education - 07/14/16 1704    Education provided Yes   Education Details HEP    Person(s) Educated Patient   Methods Explanation;Demonstration;Tactile cues;Verbal cues;Handout   Comprehension Verbalized understanding;Returned demonstration;Verbal cues required;Tactile cues required  PT Long Term Goals - 07/12/16 1610      PT LONG TERM GOAL #1   Title I with advanced HEP (07/29/16)   Time 4   Period Weeks   Status On-going     PT LONG TERM GOAL #2   Title demo strong TA contraction and good stabilization of the pelvis with core exercise ( 07/29/16)    Time 4   Period Weeks   Status Achieved     PT LONG TERM GOAL #3   Title increase strength Lt hip =/> 5/5 ( 07/29/16)    Time 4   Period Weeks   Status On-going     PT LONG TERM GOAL #4   Title report minimal to no Lt sciatica pain (07/29/16)    Time 4   Period Weeks   Status On-going  varies dependent on amount of sitting.                Plan - 07/14/16 1705    Clinical Impression Statement Patient reports some continued improvement but he has continued pain in the Lt groin and anterior thigh. He has palpable tightness in the Rt hip flexors/psoas which responded well to deep tissue work and myofacial ball release work. He reported  significant decrease in Lt LE radicular pain with prone press ups. May benefit from continued extension program with core stabilization as well as addressing the tightness in the Rt hip flexors. Spend time today with education re- sitting position - sits in the recliner when at home.    Rehab Potential Excellent   PT Frequency 2x / week   PT Duration 4 weeks   PT Treatment/Interventions Moist Heat;Ultrasound;Therapeutic exercise;Therapeutic activities;Dry needling;Manual techniques;Neuromuscular re-education;Cryotherapy;Electrical Stimulation;Iontophoresis 4mg /ml Dexamethasone;Patient/family education   PT Next Visit Plan manual work Lt piriformis, core strengthening; modalities as indicated. Progress HEP. add manual work Rt hip flexors/stretch for hip flexors/extension program    Consulted and Agree with Plan of Care Patient      Patient will benefit from skilled therapeutic intervention in order to improve the following deficits and impairments:  Pain, Increased muscle spasms, Decreased strength, Impaired flexibility  Visit Diagnosis: Acute left-sided low back pain with left-sided sciatica  Muscle weakness (generalized)  Other muscle spasm     Problem List Patient Active Problem List   Diagnosis Date Noted  . Arthritis of both knees 09/26/2013    Maila Dukes Rober Minion PT, MPH  07/14/2016, 5:11 PM  [facility] [address] [place], [place], [address] Phone: [phone]   Fax:  [phone]  Name: Ricardo Mccormick MRN: [id number] Date of Birth: [birth year]

## 2016-07-15 ENCOUNTER — Encounter: Payer: BLUE CROSS/BLUE SHIELD | Admitting: Physical Therapy

## 2016-07-19 ENCOUNTER — Encounter: Payer: Self-pay | Admitting: Rehabilitative and Restorative Service Providers"

## 2016-07-19 ENCOUNTER — Ambulatory Visit (INDEPENDENT_AMBULATORY_CARE_PROVIDER_SITE_OTHER): Payer: BLUE CROSS/BLUE SHIELD | Admitting: Rehabilitative and Restorative Service Providers"

## 2016-07-19 DIAGNOSIS — M62838 Other muscle spasm: Secondary | ICD-10-CM | POA: Diagnosis not present

## 2016-07-19 DIAGNOSIS — M5442 Lumbago with sciatica, left side: Secondary | ICD-10-CM

## 2016-07-19 DIAGNOSIS — M6281 Muscle weakness (generalized): Secondary | ICD-10-CM

## 2016-07-19 NOTE — Patient Instructions (Addendum)
Pelvic Press    Place hands under belly between navel and pubic bone, palms up. Feel pressure on hands. Increase pressure on hands by pressing pelvis down. This is NOT a pelvic tilt. Hold __5_ seconds. Relax. Repeat __10_ times.  Strengthening: Hip Abduction - Resisted   Lead out with heel core engaged  With tubing around right leg, other side toward anchor, extend leg out from side. Repeat __10__ times per set. Do __2-3__ sets per session. Do _1-2___ sessions per day.   Strengthening: Hip Extension - Resisted    With tubing around right ankle, face anchor and pull leg straight back. Repeat __10__ times per set. Do __2-3__ sets per session. Do __1-2__ sessions per day.  Angry Cat, All Fours    Kneel on hands and knees. Tuck chin and tighten stomach. Exhale and round back upward. Inhale and arch back downward. Hold each position __5_ seconds. Repeat _5-10_ times per session. Do __1-2_ sessions per day. Move segmentally through your spine  Copyright  VHI. All rights reserved.

## 2016-07-19 NOTE — Therapy (Signed)
[facility] [place] [address] [place], [place], [address] Phone: [phone]   Fax:  [phone]  Physical Therapy Treatment  Patient Details  Name: Ricardo Mccormick MRN: [id number] Date of Birth: February 21, 1956 Referring Provider: Dr Carilyn Goodpasture Docia Chuck  Encounter Date: 07/19/2016      PT End of Session - 07/19/16 0851    Visit Number 5   Number of Visits 16   Date for PT Re-Evaluation 08/26/16   PT Start Time 0847   PT Stop Time 0939   PT Time Calculation (min) 52 min   Activity Tolerance Patient tolerated treatment well      Past Medical History:  Diagnosis Date  . Arthritis of both knees 09/26/2013   Patellofemoral chondromalacia and medial joint line arthritis of the right knee. Posttraumatic arthritis of the left knee   . Diabetes mellitus without complication (HCC)   . HLD (hyperlipidemia)   . HTN (hypertension)     Past Surgical History:  Procedure Laterality Date  . COLONOSCOPY    . KNEE SURGERY Left    1980 &1992    There were no vitals filed for this visit.      Subjective Assessment - 07/19/16 0852    Subjective Lot reports that he has had no radicular Lt LE pain since last visit. He has been working on his exercses and feels this is what made the difference. He did experience some central LBP this am when getteing dressed  - not sure what happened.  Reports that he has bilat knee pain and will undergo TKA in the near future. Pleased with progress. Likes the press up.    Currently in Pain? No/denies            [facility] PT Assessment - 07/19/16 0001      Assessment   Medical Diagnosis Lt sided sciatica   Referring Provider Dr Carilyn Goodpasture Koirala   Onset Date/Surgical Date 06/08/16   Hand Dominance Right   Next MD Visit not scheduled     Palpation   Palpation comment tight and tender in Lt hip flexors; QL; piriformis                     [facility] Adult PT Treatment/Exercise - 07/19/16 0001       Lumbar Exercises: Stretches   Standing Extension 3 reps  1-2 sec hold    Prone on Elbows Stretch 1 rep  2 min    Press Ups --  10 reps 2-3 sec hold    Press Ups Limitations working on segmental extension with overpressure at sacrum by PT    Quad Stretch Limitations hip flexor stretch standing hip at table extending LE 30 sec x 2 each LE      Lumbar Exercises: Prone   Other Prone Lumbar Exercises pelvic press 5 sec x 10      Lumbar Exercises: Quadruped   [place] 10 reps  PT verbal and tactile cues for form - encouraging segmental      Moist Heat Therapy   Number Minutes Moist Heat 20 Minutes   Moist Heat Location Lumbar Spine;Hip  Lt and Rt hip flexors      Electrical Stimulation   Electrical Stimulation Location Lt glute/ lumbar   Electrical Stimulation Action IFC   Electrical Stimulation Parameters to tolerance   Electrical Stimulation Goals Pain;Tone     Manual Therapy   Manual therapy comments pt supine    Soft tissue mobilization deep tissue work through [employer]  lumbar spine/QL                 PT Education - 07/19/16 0924    Education provided Yes   Education Details HEP    Person(s) Educated Patient   Methods Explanation;Demonstration;Tactile cues;Verbal cues;Handout   Comprehension Verbalized understanding;Returned demonstration;Verbal cues required;Tactile cues required             PT Long Term Goals - 07/12/16 0817      PT LONG TERM GOAL #1   Title I with advanced HEP (07/29/16)   Time 4   Period Weeks   Status On-going     PT LONG TERM GOAL #2   Title demo strong TA contraction and good stabilization of the pelvis with core exercise ( 07/29/16)    Time 4   Period Weeks   Status Achieved     PT LONG TERM GOAL #3   Title increase strength Lt hip =/> 5/5 ( 07/29/16)    Time 4   Period Weeks   Status On-going     PT LONG TERM GOAL #4   Title report minimal to no Lt sciatica pain (07/29/16)    Time 4   Period Weeks   Status On-going   varies dependent on amount of sitting.                Plan - 07/19/16 [id number]    Clinical Impression Statement Excellent progress since last visit with focus on lumbar extensioin and stretching hip flexors; modification of sitting at home and in the car. Note improved segmental mobility through the lumbar spine. Added work for hip extension and hip abductors with some tightening through the Lt QL/lumbar area. Progressing well toward stated goals of therapy.    Rehab Potential Excellent   PT Frequency 2x / week   PT Duration 8 weeks   PT Treatment/Interventions Moist Heat;Ultrasound;Therapeutic exercise;Therapeutic activities;Dry needling;Manual techniques;Neuromuscular re-education;Cryotherapy;Electrical Stimulation;Iontophoresis 4mg /ml Dexamethasone;Patient/family education   PT Next Visit Plan manual work Lt piriformis, core strengthening; modalities as indicated. Progress HEP. manual work Rt hip flexors/stretch for hip flexors/extension program; stretch hip flexors; strengthening hip ext/abductors   Consulted and Agree with Plan of Care Patient      Patient will benefit from skilled therapeutic intervention in order to improve the following deficits and impairments:  Pain, Increased muscle spasms, Decreased strength, Impaired flexibility  Visit Diagnosis: Acute left-sided low back pain with left-sided sciatica - Plan: PT plan of care cert/re-cert  Muscle weakness (generalized) - Plan: PT plan of care cert/re-cert  Other muscle spasm - Plan: PT plan of care cert/re-cert     Problem List Patient Active Problem List   Diagnosis Date Noted  . Arthritis of both knees 09/26/2013    Kalandra Masters Rober MinionP Bryceton Hantz PT, MPH  07/19/2016, 9:36 AM  [facility] [address] [place], [place], [address] Phone: [phone]   Fax:  [phone]  Name: Ricardo Mccormick MRN: [id number] Date of Birth: August 24, 1955

## 2016-07-21 NOTE — Progress Notes (Signed)
Patient ID: Ricardo Mccormick, male   DOB: 1955-10-04, 61 y.o.   MRN: [id number]    Cardiology Office Note  NEW PATIENT   Date:  08/02/2016   ID:  Ricardo Mccormick, DOB Jan 19, 1956, MRN [id number]  PCP:  Ricardo Bussing, MD  Cardiologist:  NEW Dr. Eden Mccormick    No chief complaint on file.     History of Present Illness: Ricardo Mccormick is a 61 y.o. male who was seen by PA 2/27  for  Chest heaviness.  Pt with hx of  HTN, DM hyperlipidemia and family hx premature CAD- with father MI at 91,  with  was seen 07/07/15 for cough cold and chest tightness and placed on steroids.  With the chest pain troponin was neg. And EKG SR and no acute changes.  Pt placed on claritin and mucinex. Referred to cardiology.      Denies any further chest pain or SOB.  No palpitations.  Also being evaluated for Lupus by [facility] Rheumatology Dr. Lucienne Mccormick.    07/10/15 Calcium score 1070  With calcium in all 3 major arteries   F/U perfusion study done 07/22/15 normal with no ischemia EF 60%   He is taking aspirin and statin   No chest pain.  Working in [employer] for last 30 years   Needs bilateral knee replacements with Dr Ricardo Mccormick.    Past Medical History:  Diagnosis Date  . Arthritis of both knees 09/26/2013   Patellofemoral chondromalacia and medial joint line arthritis of the right knee. Posttraumatic arthritis of the left knee   . Diabetes mellitus without complication (HCC)   . HLD (hyperlipidemia)   . HTN (hypertension)     Past Surgical History:  Procedure Laterality Date  . COLONOSCOPY    . KNEE SURGERY Left    1980 &1992     Current Outpatient Prescriptions  Medication Sig Dispense Refill  . Ascorbic Acid (VITAMIN C) 1000 MG tablet Take 1,000 mg by mouth daily.    Marland Kitchen aspirin 81 MG tablet Take 81 mg by mouth daily.    Marland Kitchen atorvastatin (LIPITOR) 40 MG tablet Take 40 mg by mouth daily.    Marland Kitchen lisinopril (PRINIVIL,ZESTRIL) 10 MG tablet Take 10 mg by mouth daily.    . Omega-3 Fatty Acids (FISH OIL) 1200 MG CAPS  Take 1 capsule by mouth daily.     Marland Kitchen omeprazole (PRILOSEC) 20 MG capsule Take 20 mg by mouth every 3 (three) days.      No current facility-administered medications for this visit.     Allergies:   Patient has no known allergies.    Social History:  The patient  reports that he has never smoked. He has never used smokeless tobacco. He reports that he drinks about 0.6 oz of alcohol per week . He reports that he does not use drugs.   Family History:  The patient's family history includes Alzheimer's disease in his mother; Cancer in his mother; Diabetes in his maternal grandfather and sister; Epilepsy in his sister; Heart attack in his father.    ROS:  General:no colds or fevers, no weight changes Skin:no rashes or ulcers HEENT:no blurred vision, no congestion CV:see HPI PUL:see HPI GI:no diarrhea constipation or melena, no indigestion GU:no hematuria, no dysuria MS:no joint pain, no claudication Neuro:no syncope, no lightheadedness Endo:no diabetes, no thyroid disease  Wt Readings from Last 3 Encounters:  08/02/16 217 lb 4 oz (98.5 kg)  06/12/16 223 lb (101.2 kg)  07/28/15 216 lb 12.8 oz (98.3 kg)  PHYSICAL EXAM: VS:  BP 122/80   Pulse 84   Ht 5\' 10"  (1.778 m)   Wt 217 lb 4 oz (98.5 kg)   SpO2 97%   BMI 31.17 kg/m  , BMI  Affect appropriate Healthy:  appears stated age HEENT: normal Neck supple with no adenopathy JVP normal no bruits no thyromegaly Lungs clear with no wheezing and good diaphragmatic motion Heart:  S1/S2 no murmur, no rub, gallop or click PMI normal Abdomen: benighn, BS positve, no tenderness, no AAA no bruit.  No HSM or HJR Distal pulses intact with no bruits No edema Neuro non-focal Skin warm and dry No muscular weakness    EKG:    07/10/15  SR possible Lt atrial enlargement.  No changes. 08/02/16  SR rate 84 normal ECG    Recent Labs:07/07/15 Na140, K+ 4.3 LFTs were normal Cr 1.29  Bun 16 CPK 109 troponin<0.01  Lipid Panel 04/29/15  tg  127, tCHOL 147 hdl 45  LDL 76  Other studies Reviewed: Additional studies/ records that were reviewed today include: previous office notes. .   ASSESSMENT AND PLAN:  1.  Chest pain    Normal perfusion study.  High calcium score on statin and asa  2. HTN  Well controlled.  Continue current medications and low sodium Dash type diet.     3. Hyperlipidemia treated  With LDL 76 continue lipitor   4. Premature family hx CAD.  Will need ETT in March 2019   5. Preop:  Needs both knees down by Dr Ricardo HickAlusio. Will order ETT to clear for moderate risk surgery    Ricardo HawsPeter Mccormick

## 2016-07-22 ENCOUNTER — Ambulatory Visit (INDEPENDENT_AMBULATORY_CARE_PROVIDER_SITE_OTHER): Payer: BLUE CROSS/BLUE SHIELD | Admitting: Physical Therapy

## 2016-07-22 DIAGNOSIS — M6281 Muscle weakness (generalized): Secondary | ICD-10-CM

## 2016-07-22 DIAGNOSIS — M5442 Lumbago with sciatica, left side: Secondary | ICD-10-CM | POA: Diagnosis not present

## 2016-07-22 DIAGNOSIS — M62838 Other muscle spasm: Secondary | ICD-10-CM | POA: Diagnosis not present

## 2016-07-22 NOTE — Therapy (Signed)
[place], [place], [address] Phone: [phone]   Fax:  [phone]  Physical Therapy Treatment  Patient Details  Name: Ricardo Mccormick MRN: [id number] Date of Birth: 04/07/1956 Referring Provider: Dr. Lujean Amel  Encounter Date: 07/22/2016      PT End of Session - 07/22/16 0803    Visit Number 6   Number of Visits 16   Date for PT Re-Evaluation 08/26/16   PT Start Time 0803   PT Stop Time 0846   PT Time Calculation (min) 43 min   Activity Tolerance Patient tolerated treatment well   Behavior During Therapy [facility] for tasks assessed/performed      Past Medical History:  Diagnosis Date  . Arthritis of both knees 09/26/2013   Patellofemoral chondromalacia and medial joint line arthritis of the right knee. Posttraumatic arthritis of the left knee   . Diabetes mellitus without complication ([place])   . HLD (hyperlipidemia)   . HTN (hypertension)     Past Surgical History:  Procedure Laterality Date  . COLONOSCOPY    . KNEE SURGERY Left    1980 &1992    There were no vitals filed for this visit.      Subjective Assessment - 07/22/16 0804    Subjective He reports he continues to feel tightness and mild pain upon waking (2/10); with stretches and moving around, pain decreases.  Using lumbar support in chairs has helped some.     Currently in Pain? No/denies   Pain Score 0-No pain            [facility] PT Assessment - 07/22/16 0001      Assessment   Medical Diagnosis Lt sided sciatica   Referring Provider Dr. Lauretta Grill [place]   Onset Date/Surgical Date 06/08/16   Hand Dominance Right   Next MD Visit not scheduled          [facility] Adult PT Treatment/Exercise - 07/22/16 0001      Lumbar Exercises: Stretches   Passive Hamstring Stretch 2 reps;30 seconds. Supine with strap   Prone on Elbows Stretch 1 rep  2 min    Press Ups --  10 reps 2-3 sec hold    Press Ups Limitations working on  segmental extension with overpressure at sacrum by PT    Quad Stretch Limitations hip flexor stretch standing, with arm over head 30 sec x 2 each LE, 20 sec    ITB Stretch 2 reps;30 seconds     Lumbar Exercises: Aerobic   Stationary Bike NuStep L5: 5 min      Lumbar Exercises: Standing   Other Standing Lumbar Exercises Hip abd x 10 each leg, VC for technique, hip ext x 10 each leg.      Lumbar Exercises: Prone   Other Prone Lumbar Exercises pelvic press x 5 sec x 5 reps; repeated with hip ext, knee straight x 5 reps each leg, unilateral knee flex/ext x 5 reps each side; bilat knee flex/ext x 5 reps       Lumbar Exercises: Quadruped   Madcat/Old Horse 5 reps   tactile cues for form - encouraging segmental      Modalities   Modalities --  pt declined     Manual Therapy   Manual therapy comments pt prone   Soft tissue mobilization cross fiber friction to Lt lumbar/thoracic paraspinals; STM to Lt glute and deep rotators with passive hip ER/IR.  PT Long Term Goals - 07/22/16 0813      PT LONG TERM GOAL #1   Title I with advanced HEP (07/29/16)   Time 4   Period Weeks   Status On-going     PT LONG TERM GOAL #2   Title demo strong TA contraction and good stabilization of the pelvis with core exercise ( 07/29/16)    Time 4   Period Weeks   Status Achieved     PT LONG TERM GOAL #3   Title increase strength Lt hip =/> 5/5 ( 07/29/16)    Time 4   Period Weeks   Status On-going     PT LONG TERM GOAL #4   Title report minimal to no Lt sciatica pain (07/29/16)    Time 4   Period Weeks   Status Achieved               Plan - 07/22/16 9563    Clinical Impression Statement Pt tolerated all exercises well, with no increase in symptoms.  He required min VC for form.  Notably less tightness in Lt hip and bilat low back with manual therapy.  Pt is no longer experiencing sciatica; has met LTG#4. Pt painfree at end of session; declined use of modalities.    Rehab  Potential Excellent   PT Frequency 2x / week   PT Duration 8 weeks   PT Treatment/Interventions Moist Heat;Ultrasound;Therapeutic exercise;Therapeutic activities;Dry needling;Manual techniques;Neuromuscular re-education;Cryotherapy;Electrical Stimulation;Iontophoresis 38m/ml Dexamethasone;Patient/family education   PT Next Visit Plan Progress HEP as tolerated.  Continued focus on extension and spinal stabilization.  Manual and modalities as indicated. Assess hip strength for LTG#3   Consulted and Agree with Plan of Care Patient      Patient will benefit from skilled therapeutic intervention in order to improve the following deficits and impairments:  Pain, Increased muscle spasms, Decreased strength, Impaired flexibility  Visit Diagnosis: Acute left-sided low back pain with left-sided sciatica  Muscle weakness (generalized)  Other muscle spasm     Problem List Patient Active Problem List   Diagnosis Date Noted  . Arthritis of both knees 09/26/2013   JKerin Mccormick PTA 07/22/16 9:03 AM   [address] [address]Phone: [phone]  Fax:  [phone] Name: KBee HammerschmidtMRN: [id number] of Birth: 130-Jun-1957

## 2016-07-26 ENCOUNTER — Encounter: Payer: Self-pay | Admitting: Cardiovascular Disease

## 2016-07-26 ENCOUNTER — Encounter: Payer: BLUE CROSS/BLUE SHIELD | Admitting: Rehabilitative and Restorative Service Providers"

## 2016-07-27 ENCOUNTER — Ambulatory Visit (INDEPENDENT_AMBULATORY_CARE_PROVIDER_SITE_OTHER): Payer: BLUE CROSS/BLUE SHIELD | Admitting: Physical Therapy

## 2016-07-27 DIAGNOSIS — M5442 Lumbago with sciatica, left side: Secondary | ICD-10-CM | POA: Diagnosis not present

## 2016-07-27 DIAGNOSIS — M6281 Muscle weakness (generalized): Secondary | ICD-10-CM | POA: Diagnosis not present

## 2016-07-27 NOTE — Therapy (Signed)
[place], [place], [address] Phone: [phone]   Fax:  [phone]  Physical Therapy Treatment  Patient Details  Name: Ricardo Mccormick MRN: [id number] Date of Birth: [birth year] Referring Provider: Dr. Lujean Amel  Encounter Date: 07/27/2016      PT End of Session - 07/27/16 0807    Visit Number 7   Number of Visits 16   Date for PT Re-Evaluation 08/26/16   PT Start Time 0800   PT Stop Time 0848   PT Time Calculation (min) 48 min      Past Medical History:  Diagnosis Date  . Arthritis of both knees 09/26/2013   Patellofemoral chondromalacia and medial joint line arthritis of the right knee. Posttraumatic arthritis of the left knee   . Diabetes mellitus without complication ([place])   . HLD (hyperlipidemia)   . HTN (hypertension)     Past Surgical History:  Procedure Laterality Date  . COLONOSCOPY    . KNEE SURGERY Left    1980 &1992    There were no vitals filed for this visit.      Subjective Assessment - 07/27/16 0803    Subjective Pt reports his back and leg have quieted down since doing stretches and using lumbar support.  He reports he is feeling close to ready to d/c.  He has noticed his knees "giving out" when walking lately.  He is awaiting his mouth to heal after tooth removal prior to scheduling TKR surgery.    Patient Stated Goals get back to normal and get rid of pain   Currently in Pain? No/denies   Pain Score 0-No pain            [facility] PT Assessment - 07/27/16 0001      Assessment   Medical Diagnosis Lt sided sciatica   Referring Provider Dr. Lauretta Grill Koirala   Onset Date/Surgical Date 06/08/16   Hand Dominance Right   Next MD Visit not scheduled     Strength   Right Hip Flexion 5/5   Right Hip Extension --  5-/5   Right Hip ABduction 5/5   Left Hip Flexion 5/5   Left Hip Extension --  5-/5   Left Hip ABduction 5/5           [facility] Adult PT  Treatment/Exercise - 07/27/16 0001      Lumbar Exercises: Stretches   Passive Hamstring Stretch 2 reps;30 seconds   Standing Extension 2 reps;10 seconds   Press Ups --  10 reps 2-3 sec hold    Quad Stretch Limitations hip flexor stretch standing, with arm over head 30 sec x 3 each LE, 20 sec    ITB Stretch 2 reps;30 seconds   Piriformis Stretch 2 reps;30 seconds  each leg     Lumbar Exercises: Aerobic   Stationary Bike NuStep L5: 5 min      Lumbar Exercises: Seated   Sit to Stand 10 reps  with core engaged, mirror and VC for feedback of form.      Lumbar Exercises: Supine   Straight Leg Raise 10 reps  with core engaged and LE ER, each leg.     Lumbar Exercises: Prone   Opposite Arm/Leg Raise Right arm/Left leg;Left arm/Right leg;5 reps   Other Prone Lumbar Exercises pelvic press x 5 sec x 5 reps; repeated with hip ext, knee straight x 5 reps each leg, unilateral knee flex/ext x 5 reps each side; bilat knee flex/ext x 5 reps  Lumbar Exercises: Quadruped   Madcat/Old Horse 5 reps   tactile cues for form - encouraging segmental      Modalities   Modalities --  pt declined                PT Education - 07/27/16 0845    Education provided Yes   Education Details HEP   Person(s) Educated Patient   Methods Explanation   Comprehension Verbalized understanding;Returned demonstration             PT Long Term Goals - 07/27/16 0818      PT LONG TERM GOAL #1   Title I with advanced HEP (07/29/16)   Time 4   Period Weeks   Status On-going     PT LONG TERM GOAL #2   Title demo strong TA contraction and good stabilization of the pelvis with core exercise ( 07/29/16)    Time 4   Period Weeks   Status Achieved     PT LONG TERM GOAL #3   Title increase strength Lt hip =/> 5/5 ( 07/29/16)    Time 4   Period Weeks   Status Partially Met     PT LONG TERM GOAL #4   Title report minimal to no Lt sciatica pain (07/29/16)    Time 4   Period Weeks   Status  Achieved               Plan - 07/27/16 0837    Clinical Impression Statement Pt continues to be pain free and tolerating spine stabilization exercises without production of symptoms.  Hip extensors getting stronger, yet test (5-/5); has not met LTG #3 yet.  Making good gains towards remaining goals.     Rehab Potential Excellent   PT Frequency 2x / week   PT Duration 8 weeks   PT Treatment/Interventions Moist Heat;Ultrasound;Therapeutic exercise;Therapeutic activities;Dry needling;Manual techniques;Neuromuscular re-education;Cryotherapy;Electrical Stimulation;Iontophoresis 36m/ml Dexamethasone;Patient/family education   PT Next Visit Plan Assess readiness for d/c.     Consulted and Agree with Plan of Care Patient      Patient will benefit from skilled therapeutic intervention in order to improve the following deficits and impairments:  Pain, Increased muscle spasms, Decreased strength, Impaired flexibility  Visit Diagnosis: Acute left-sided low back pain with left-sided sciatica  Muscle weakness (generalized)     Problem List Patient Active Problem List   Diagnosis Date Noted  . Arthritis of both knees 09/26/2013   JKerin Perna PTA 07/27/16 8:58 AM  [facility] [address]Phone: [phone]  Fax:  [phone] Name: Ricardo BurciagaMRN: [id number] of Birth: 1October 13, 1957

## 2016-07-27 NOTE — Patient Instructions (Addendum)
Straight Leg Raise: With External Leg Rotation    Lie on back with right leg straight, opposite leg bent. Rotate straight leg out and lift __8__ inches. Repeat _10_ times per set. Do __2__ sets per session. Do ____ sessions per day.  http://orth.exer.us/728   Copyright  VHI. All rights reserved.  Functional Quadriceps: Chair Squat    Keeping feet flat on floor, shoulder width apart, squat as low as is comfortable. Use support as necessary. Repeat _10___ times per set. Do _2___ sets per session. Do __3__ sessions per week   [facility] [address] [place], [place] [address]  [phone] (office) [phone] (fax)

## 2016-07-29 ENCOUNTER — Ambulatory Visit (INDEPENDENT_AMBULATORY_CARE_PROVIDER_SITE_OTHER): Payer: BLUE CROSS/BLUE SHIELD | Admitting: Rehabilitative and Restorative Service Providers"

## 2016-07-29 ENCOUNTER — Encounter: Payer: Self-pay | Admitting: Rehabilitative and Restorative Service Providers"

## 2016-07-29 DIAGNOSIS — M62838 Other muscle spasm: Secondary | ICD-10-CM | POA: Diagnosis not present

## 2016-07-29 DIAGNOSIS — M6281 Muscle weakness (generalized): Secondary | ICD-10-CM

## 2016-07-29 DIAGNOSIS — M5442 Lumbago with sciatica, left side: Secondary | ICD-10-CM | POA: Diagnosis not present

## 2016-07-29 NOTE — Patient Instructions (Addendum)
Strengthening: Hip Abduction (Side-Lying)    Tighten muscles on front of left thigh, then lift leg _10-12___ inches from surface, keeping knee locked.  Repeat __10__ times per set. Do _1-3___ sets per session. Do ___1_ sessions per day. Keep core engaged; keep over on the side tilted forward a bit; leg behind the plane of the body; leading up with heel   Can also lift and make circles CW/CCW  Can also lift and tap forward and tap backward    TENS UNIT: This is helpful for muscle pain and spasm.   Search and Purchase a TENS 7000 2nd edition at www.tenspros.com. It should be less than $30.     TENS unit instructions: Do not shower or bathe with the unit on Turn the unit off before removing electrodes or batteries If the electrodes lose stickiness add a drop of water to the electrodes after they are disconnected from the unit and place on plastic sheet. If you continued to have difficulty, call the TENS unit company to purchase more electrodes. Do not apply lotion on the skin area prior to use. Make sure the skin is clean and dry as this will help prolong the life of the electrodes. After use, always check skin for unusual red areas, rash or other skin difficulties. If there are any skin problems, does not apply electrodes to the same area. Never remove the electrodes from the unit by pulling the wires. Do not use the TENS unit or electrodes other than as directed. Do not change electrode placement without consultating your therapist or physician. Keep 2 fingers with between each electrode.

## 2016-07-29 NOTE — Therapy (Addendum)
[place], [place], [address] Phone: [phone]   Fax:  [phone]  Physical Therapy Treatment  Patient Details  Name: Arlie Riker MRN: [id number] Date of Birth: 14-Aug-1955 Referring Provider: Dr. Lujean Amel  Encounter Date: 07/29/2016      PT End of Session - 07/29/16 0802    Visit Number 8   Number of Visits 16   Date for PT Re-Evaluation 08/26/16   PT Start Time 0803   PT Stop Time 0849   PT Time Calculation (min) 46 min   Activity Tolerance Patient tolerated treatment well      Past Medical History:  Diagnosis Date  . Arthritis of both knees 09/26/2013   Patellofemoral chondromalacia and medial joint line arthritis of the right knee. Posttraumatic arthritis of the left knee   . Diabetes mellitus without complication ([place])   . HLD (hyperlipidemia)   . HTN (hypertension)     Past Surgical History:  Procedure Laterality Date  . COLONOSCOPY    . KNEE SURGERY Left    1980 &1992    There were no vitals filed for this visit.      Subjective Assessment - 07/29/16 0804    Subjective Pleased with progress and feels ready for discharge. No longer having any back pain or any pain into the LE.    Currently in Pain? No/denies                         [facility] Adult PT Treatment/Exercise - 07/29/16 0001      Lumbar Exercises: Stretches   Passive Hamstring Stretch 2 reps;30 seconds   Standing Extension 2 reps;10 seconds   Press Ups --  10 reps 2-3 sec hold    Quad Stretch Limitations hip flexor stretch standing, with arm over head 30 sec x 3 each LE, 20 sec    ITB Stretch 2 reps;30 seconds   Piriformis Stretch 2 reps;30 seconds  each leg     Lumbar Exercises: Aerobic   Tread Mill reverse walking 1.2 to 1.7 mph x 8 min      Lumbar Exercises: Standing   Other Standing Lumbar Exercises Hip abd x 10 each leg, hip ext x 10 each leg - green TB       Lumbar Exercises:  Seated   Hip Flexion on Ball Limitations hip flexor stretch sitting one hip on chair other LE extended - leaning back arm up 30-45 sec hold x 3 each; repeat with UE over head x 2 x 30 sec hold      Lumbar Exercises: Sidelying   Hip Abduction 10 reps   Other Sidelying Lumbar Exercises tap fwd/backx 10      Manual Therapy   Manual therapy comments supine    Soft tissue mobilization deep tissue work hip flexors                 PT Education - 07/29/16 0830    Education provided Yes   Education Details HEP    Person(s) Educated Patient   Methods Explanation;Demonstration;Tactile cues;Verbal cues;Handout   Comprehension Verbalized understanding;Returned demonstration;Verbal cues required;Tactile cues required             PT Long Term Goals - 07/29/16 0806      PT LONG TERM GOAL #1   Title I with advanced HEP (07/29/16)   Time 4   Period Weeks   Status Achieved     PT LONG TERM  GOAL #2   Title demo strong TA contraction and good stabilization of the pelvis with core exercise ( 07/29/16)    Time 4   Period Weeks   Status Achieved     PT LONG TERM GOAL #3   Title increase strength Lt hip =/> 5/5 ( 07/29/16)    Time 4   Period Weeks   Status Achieved     PT LONG TERM GOAL #4   Title report minimal to no Lt sciatica pain (07/29/16)    Time 4   Period Weeks   Status Achieved               Plan - 07/29/16 0807    Clinical Impression Statement Goals of rehab accplished. Patient is independent in HEP and ready for discharge.    Rehab Potential Excellent   PT Frequency 2x / week   PT Duration 8 weeks   PT Treatment/Interventions Moist Heat;Ultrasound;Therapeutic exercise;Therapeutic activities;Dry needling;Manual techniques;Neuromuscular re-education;Cryotherapy;Electrical Stimulation;Iontophoresis 69m/ml Dexamethasone;Patient/family education   PT Next Visit Plan On hold x 2 weeks - will D/C to independent HEP if pt continues to do well with HEP    Consulted  and Agree with Plan of Care Patient      Patient will benefit from skilled therapeutic intervention in order to improve the following deficits and impairments:  Pain, Increased muscle spasms, Decreased strength, Impaired flexibility  Visit Diagnosis: Acute left-sided low back pain with left-sided sciatica  Muscle weakness (generalized)  Other muscle spasm     Problem List Patient Active Problem List   Diagnosis Date Noted  . Arthritis of both knees 09/26/2013    Kariya Lavergne PNilda SimmerPT, MPH  07/29/2016, 8:50 AM  [facility] [address]Phone: [phone]  Fax:  [phone] Name: KAldahir LitakerMRN: [id number] of Birth: 109-Aug-1957 PHYSICAL THERAPY DISCHARGE SUMMARY  Visits from Start of Care: 8  Current functional level related to goals / functional outcomes: See progress note for discharge status    Remaining deficits: Unknown    Education / Equipment: HEP Plan: Patient agrees to discharge.  Patient goals were partially met. Patient is being discharged due to not returning since the last visit.  ?????     Roczen Waymire P. HHelene KelpPT, MPH 03/29/17 7:55 AM

## 2016-08-02 ENCOUNTER — Ambulatory Visit (INDEPENDENT_AMBULATORY_CARE_PROVIDER_SITE_OTHER): Payer: BLUE CROSS/BLUE SHIELD | Admitting: Cardiovascular Disease

## 2016-08-02 ENCOUNTER — Encounter: Payer: Self-pay | Admitting: Cardiovascular Disease

## 2016-08-02 VITALS — BP 122/80 | HR 84 | Ht 70.0 in | Wt 217.2 lb

## 2016-08-02 DIAGNOSIS — Z01818 Encounter for other preprocedural examination: Secondary | ICD-10-CM

## 2016-08-02 DIAGNOSIS — Z8249 Family history of ischemic heart disease and other diseases of the circulatory system: Secondary | ICD-10-CM | POA: Diagnosis not present

## 2016-08-02 DIAGNOSIS — I10 Essential (primary) hypertension: Secondary | ICD-10-CM

## 2016-08-02 NOTE — Patient Instructions (Addendum)
Medication Instructions:  Your physician recommends that you continue on your current medications as directed. Please refer to the Current Medication list given to you today.  Labwork: NONE  Testing/Procedures: Your physician has requested that you have an exercise tolerance test. For further information please visit www.cardiosmart.org. Please also follow instruction sheet, as given.  Follow-Up: Your physician wants you to follow-up in: 12 months with Dr. Nishan. You will receive a reminder letter in the mail two months in advance. If you don't receive a letter, please call our office to schedule the follow-up appointment.   If you need a refill on your cardiac medications before your next appointment, please call your pharmacy.    

## 2016-08-09 ENCOUNTER — Ambulatory Visit (INDEPENDENT_AMBULATORY_CARE_PROVIDER_SITE_OTHER): Payer: BLUE CROSS/BLUE SHIELD

## 2016-08-09 DIAGNOSIS — Z01818 Encounter for other preprocedural examination: Secondary | ICD-10-CM

## 2016-08-09 DIAGNOSIS — Z8249 Family history of ischemic heart disease and other diseases of the circulatory system: Secondary | ICD-10-CM

## 2016-08-09 DIAGNOSIS — I10 Essential (primary) hypertension: Secondary | ICD-10-CM | POA: Diagnosis not present

## 2016-08-09 LAB — EXERCISE TOLERANCE TEST
Estimated workload: 7 METS
Exercise duration (min): 5 min
Exercise duration (sec): 14 s
MPHR: 161 {beats}/min
Peak HR: 150 {beats}/min
Percent HR: 93 %
Percent of predicted max HR: 93 %
RPE: 16
Rest HR: 91 {beats}/min
Stage 1 DBP: 89 mmHg
Stage 1 Grade: 0 %
Stage 1 HR: 91 {beats}/min
Stage 1 SBP: 148 mmHg
Stage 1 Speed: 0 mph
Stage 2 Grade: 0 %
Stage 2 HR: 107 {beats}/min
Stage 2 Speed: 0 mph
Stage 3 Grade: 0 %
Stage 3 HR: 101 {beats}/min
Stage 3 Speed: 1 mph
Stage 4 Grade: 0 %
Stage 4 HR: 100 {beats}/min
Stage 4 Speed: 1 mph
Stage 5 DBP: 82 mmHg
Stage 5 Grade: 10 %
Stage 5 HR: 129 {beats}/min
Stage 5 SBP: 172 mmHg
Stage 5 Speed: 1.7 mph
Stage 6 Grade: 12 %
Stage 6 HR: 150 {beats}/min
Stage 6 Speed: 2.5 mph
Stage 7 DBP: 77 mmHg
Stage 7 Grade: 0 %
Stage 7 HR: 137 {beats}/min
Stage 7 SBP: 191 mmHg
Stage 7 Speed: 0 mph
Stage 8 DBP: 85 mmHg
Stage 8 Grade: 0 %
Stage 8 HR: 108 {beats}/min
Stage 8 SBP: 136 mmHg
Stage 8 Speed: 0 mph

## 2016-08-31 ENCOUNTER — Other Ambulatory Visit: Payer: Self-pay | Admitting: Family Medicine

## 2016-08-31 ENCOUNTER — Ambulatory Visit
Admission: RE | Admit: 2016-08-31 | Discharge: 2016-08-31 | Disposition: A | Discharge status: Home / Self Care | Payer: BLUE CROSS/BLUE SHIELD | Source: Ambulatory Visit | Attending: Family Medicine | Admitting: Family Medicine

## 2016-08-31 DIAGNOSIS — Z01818 Encounter for other preprocedural examination: Secondary | ICD-10-CM | POA: Diagnosis not present

## 2016-08-31 DIAGNOSIS — M179 Osteoarthritis of knee, unspecified: Secondary | ICD-10-CM | POA: Diagnosis not present

## 2016-09-06 DIAGNOSIS — M1711 Unilateral primary osteoarthritis, right knee: Secondary | ICD-10-CM | POA: Diagnosis not present

## 2016-09-20 DIAGNOSIS — M1711 Unilateral primary osteoarthritis, right knee: Secondary | ICD-10-CM | POA: Diagnosis not present

## 2016-10-04 DIAGNOSIS — M1711 Unilateral primary osteoarthritis, right knee: Secondary | ICD-10-CM | POA: Diagnosis not present

## 2016-10-04 DIAGNOSIS — Z96651 Presence of right artificial knee joint: Secondary | ICD-10-CM | POA: Diagnosis not present

## 2016-10-04 DIAGNOSIS — M179 Osteoarthritis of knee, unspecified: Secondary | ICD-10-CM | POA: Diagnosis not present

## 2016-10-11 DIAGNOSIS — M1711 Unilateral primary osteoarthritis, right knee: Secondary | ICD-10-CM | POA: Diagnosis not present

## 2016-10-12 DIAGNOSIS — M1711 Unilateral primary osteoarthritis, right knee: Secondary | ICD-10-CM | POA: Diagnosis not present

## 2016-10-13 DIAGNOSIS — M1711 Unilateral primary osteoarthritis, right knee: Secondary | ICD-10-CM | POA: Diagnosis not present

## 2016-11-14 DIAGNOSIS — E7800 Pure hypercholesterolemia, unspecified: Secondary | ICD-10-CM | POA: Diagnosis not present

## 2016-11-14 DIAGNOSIS — I10 Essential (primary) hypertension: Secondary | ICD-10-CM | POA: Diagnosis not present

## 2016-11-14 DIAGNOSIS — R7303 Prediabetes: Secondary | ICD-10-CM | POA: Diagnosis not present

## 2016-11-28 DIAGNOSIS — Z809 Family history of malignant neoplasm, unspecified: Secondary | ICD-10-CM | POA: Diagnosis not present

## 2016-11-28 DIAGNOSIS — L570 Actinic keratosis: Secondary | ICD-10-CM | POA: Diagnosis not present

## 2016-11-28 DIAGNOSIS — Z1283 Encounter for screening for malignant neoplasm of skin: Secondary | ICD-10-CM | POA: Diagnosis not present

## 2016-11-28 DIAGNOSIS — L821 Other seborrheic keratosis: Secondary | ICD-10-CM | POA: Diagnosis not present

## 2016-12-06 DIAGNOSIS — E119 Type 2 diabetes mellitus without complications: Secondary | ICD-10-CM | POA: Diagnosis not present

## 2016-12-21 DIAGNOSIS — H2512 Age-related nuclear cataract, left eye: Secondary | ICD-10-CM | POA: Diagnosis not present

## 2016-12-28 DIAGNOSIS — H2511 Age-related nuclear cataract, right eye: Secondary | ICD-10-CM | POA: Diagnosis not present

## 2016-12-28 DIAGNOSIS — H25811 Combined forms of age-related cataract, right eye: Secondary | ICD-10-CM | POA: Diagnosis not present

## 2017-01-10 DIAGNOSIS — N183 Chronic kidney disease, stage 3 (moderate): Secondary | ICD-10-CM | POA: Diagnosis not present

## 2017-01-10 DIAGNOSIS — R768 Other specified abnormal immunological findings in serum: Secondary | ICD-10-CM | POA: Diagnosis not present

## 2017-01-10 DIAGNOSIS — R7989 Other specified abnormal findings of blood chemistry: Secondary | ICD-10-CM | POA: Diagnosis not present

## 2017-01-10 DIAGNOSIS — M064 Inflammatory polyarthropathy: Secondary | ICD-10-CM | POA: Diagnosis not present

## 2017-01-19 DIAGNOSIS — H2512 Age-related nuclear cataract, left eye: Secondary | ICD-10-CM | POA: Diagnosis not present

## 2017-02-21 DIAGNOSIS — H2512 Age-related nuclear cataract, left eye: Secondary | ICD-10-CM | POA: Diagnosis not present

## 2017-02-21 DIAGNOSIS — H33312 Horseshoe tear of retina without detachment, left eye: Secondary | ICD-10-CM | POA: Diagnosis not present

## 2017-02-21 DIAGNOSIS — H43811 Vitreous degeneration, right eye: Secondary | ICD-10-CM | POA: Diagnosis not present

## 2017-02-21 DIAGNOSIS — H43391 Other vitreous opacities, right eye: Secondary | ICD-10-CM | POA: Diagnosis not present

## 2017-02-21 DIAGNOSIS — H4312 Vitreous hemorrhage, left eye: Secondary | ICD-10-CM | POA: Diagnosis not present

## 2017-02-23 DIAGNOSIS — M1712 Unilateral primary osteoarthritis, left knee: Secondary | ICD-10-CM | POA: Diagnosis not present

## 2017-03-03 DIAGNOSIS — H33312 Horseshoe tear of retina without detachment, left eye: Secondary | ICD-10-CM | POA: Diagnosis not present

## 2017-03-21 DIAGNOSIS — M1712 Unilateral primary osteoarthritis, left knee: Secondary | ICD-10-CM | POA: Diagnosis not present

## 2017-03-21 DIAGNOSIS — Z96651 Presence of right artificial knee joint: Secondary | ICD-10-CM | POA: Diagnosis not present

## 2017-03-24 DIAGNOSIS — M1712 Unilateral primary osteoarthritis, left knee: Secondary | ICD-10-CM | POA: Diagnosis not present

## 2017-03-27 DIAGNOSIS — M1712 Unilateral primary osteoarthritis, left knee: Secondary | ICD-10-CM | POA: Diagnosis not present

## 2017-03-28 DIAGNOSIS — M1712 Unilateral primary osteoarthritis, left knee: Secondary | ICD-10-CM | POA: Diagnosis not present

## 2017-03-29 DIAGNOSIS — M1712 Unilateral primary osteoarthritis, left knee: Secondary | ICD-10-CM | POA: Diagnosis not present

## 2017-04-02 DIAGNOSIS — M1712 Unilateral primary osteoarthritis, left knee: Secondary | ICD-10-CM | POA: Diagnosis not present

## 2017-04-03 DIAGNOSIS — M1712 Unilateral primary osteoarthritis, left knee: Secondary | ICD-10-CM | POA: Diagnosis not present

## 2017-04-05 DIAGNOSIS — M1712 Unilateral primary osteoarthritis, left knee: Secondary | ICD-10-CM | POA: Diagnosis not present

## 2017-04-10 DIAGNOSIS — M1712 Unilateral primary osteoarthritis, left knee: Secondary | ICD-10-CM | POA: Diagnosis not present

## 2017-04-11 DIAGNOSIS — M1712 Unilateral primary osteoarthritis, left knee: Secondary | ICD-10-CM | POA: Diagnosis not present

## 2017-04-15 DIAGNOSIS — M1712 Unilateral primary osteoarthritis, left knee: Secondary | ICD-10-CM | POA: Diagnosis not present

## 2017-04-17 DIAGNOSIS — M791 Myalgia, unspecified site: Secondary | ICD-10-CM | POA: Diagnosis not present

## 2017-04-17 DIAGNOSIS — M9903 Segmental and somatic dysfunction of lumbar region: Secondary | ICD-10-CM | POA: Diagnosis not present

## 2017-04-18 DIAGNOSIS — M1712 Unilateral primary osteoarthritis, left knee: Secondary | ICD-10-CM | POA: Diagnosis not present

## 2017-04-19 DIAGNOSIS — M791 Myalgia, unspecified site: Secondary | ICD-10-CM | POA: Diagnosis not present

## 2017-04-19 DIAGNOSIS — M9903 Segmental and somatic dysfunction of lumbar region: Secondary | ICD-10-CM | POA: Diagnosis not present

## 2017-04-20 DIAGNOSIS — M1712 Unilateral primary osteoarthritis, left knee: Secondary | ICD-10-CM | POA: Diagnosis not present

## 2017-04-21 DIAGNOSIS — M1712 Unilateral primary osteoarthritis, left knee: Secondary | ICD-10-CM | POA: Diagnosis not present

## 2017-04-26 DIAGNOSIS — M1712 Unilateral primary osteoarthritis, left knee: Secondary | ICD-10-CM | POA: Diagnosis not present

## 2017-04-27 DIAGNOSIS — M1712 Unilateral primary osteoarthritis, left knee: Secondary | ICD-10-CM | POA: Diagnosis not present

## 2017-05-02 DIAGNOSIS — M1712 Unilateral primary osteoarthritis, left knee: Secondary | ICD-10-CM | POA: Diagnosis not present

## 2017-05-04 DIAGNOSIS — M1712 Unilateral primary osteoarthritis, left knee: Secondary | ICD-10-CM | POA: Diagnosis not present

## 2017-05-11 DIAGNOSIS — M1712 Unilateral primary osteoarthritis, left knee: Secondary | ICD-10-CM | POA: Diagnosis not present

## 2017-05-19 DIAGNOSIS — Z125 Encounter for screening for malignant neoplasm of prostate: Secondary | ICD-10-CM | POA: Diagnosis not present

## 2017-05-19 DIAGNOSIS — Z0000 Encounter for general adult medical examination without abnormal findings: Secondary | ICD-10-CM | POA: Diagnosis not present

## 2017-05-19 DIAGNOSIS — E7800 Pure hypercholesterolemia, unspecified: Secondary | ICD-10-CM | POA: Diagnosis not present

## 2017-05-19 DIAGNOSIS — E119 Type 2 diabetes mellitus without complications: Secondary | ICD-10-CM | POA: Diagnosis not present

## 2017-05-19 DIAGNOSIS — I10 Essential (primary) hypertension: Secondary | ICD-10-CM | POA: Diagnosis not present

## 2017-06-07 DIAGNOSIS — H43813 Vitreous degeneration, bilateral: Secondary | ICD-10-CM | POA: Diagnosis not present

## 2017-06-14 IMAGING — CT CT HEART SCORING
2 series · 16 of 20 positions shown, 18 images · non-contrast
Comparison: 07/07/2015

CLINICAL DATA: Risk stratification

EXAM:
Coronary Calcium Score
TECHNIQUE: The patient was scanned on a Siemens Sensation 16 slice scanner.
Axial non-contrast 3mm slices were carried out through the heart.
The data set was analyzed on a dedicated work station and scored
using the Agatson method.
TECHNIQUE: CT heart was performed on a 256 channel system using prospective ECG
gating. A scout and noncontrast exam (for calcium scoring) were
performed. Note that this exam targets the heart and the chest was
not imaged in its entirety.

[Series 2: casc 3.0 i36f 2 bestdiast 74 % · axial · 0.35mm/px · z∈[-232,-136]mm · 8 of 42 slices shown, 10 images]
[im 5/42  vessel]
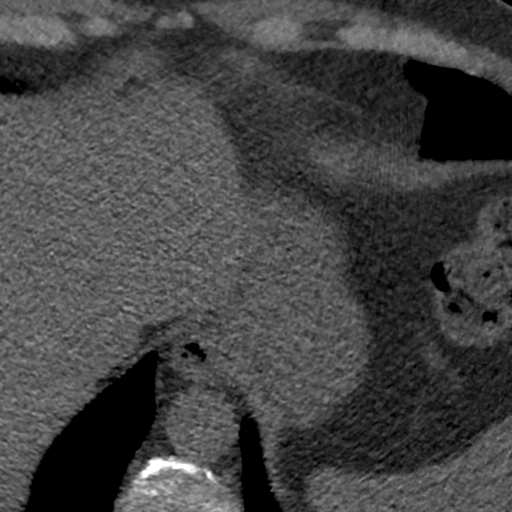
[im 5/42  lung]
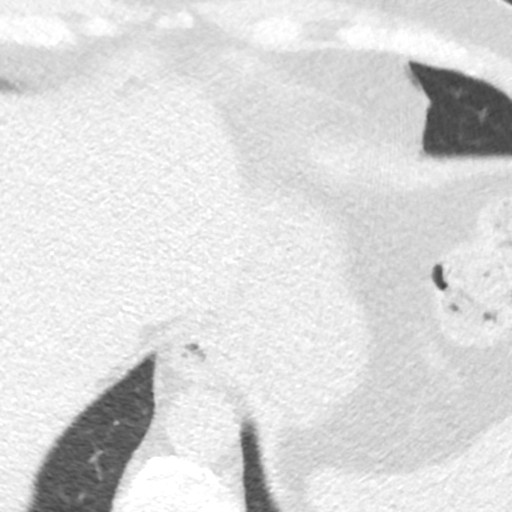
[im 10/42  vessel]
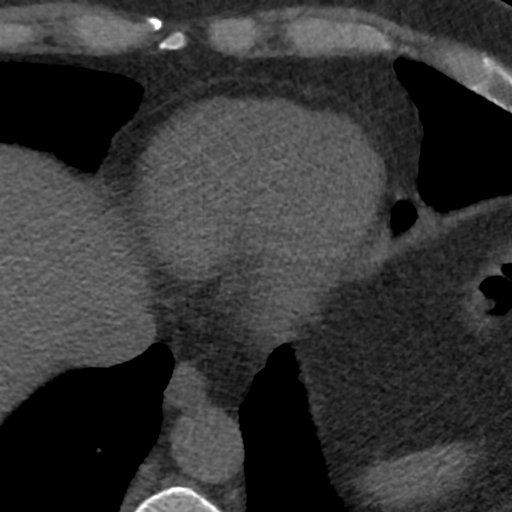
[im 14/42  vessel]
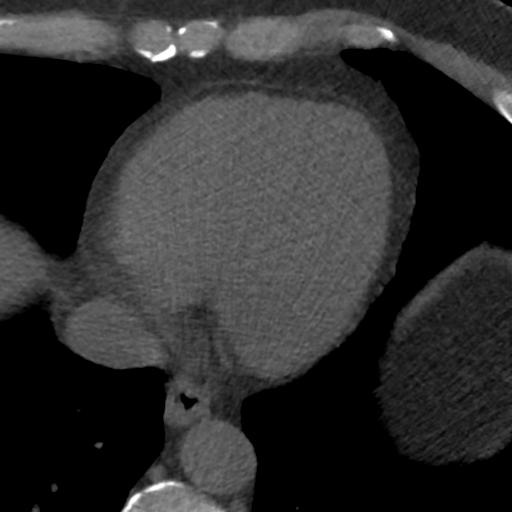
[im 19/42  vessel]
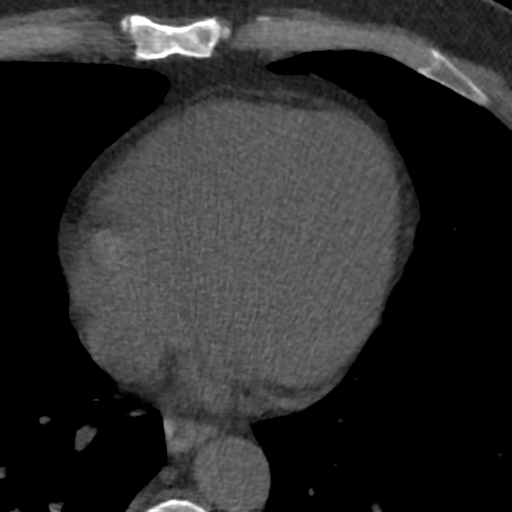
[im 23/42  vessel]
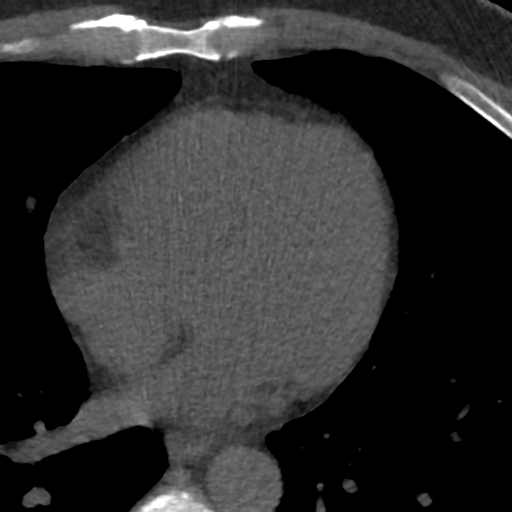
[im 23/42  lung]
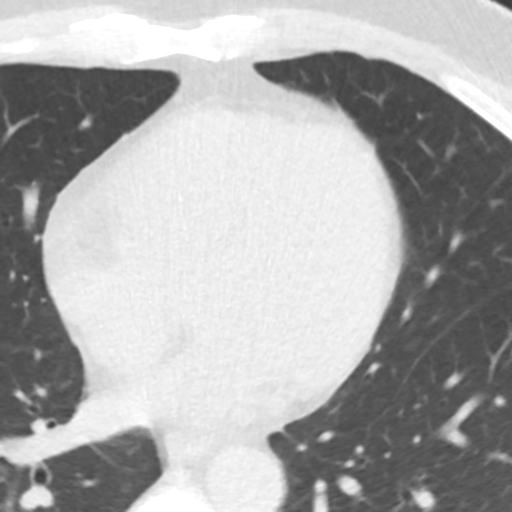
[im 28/42  vessel]
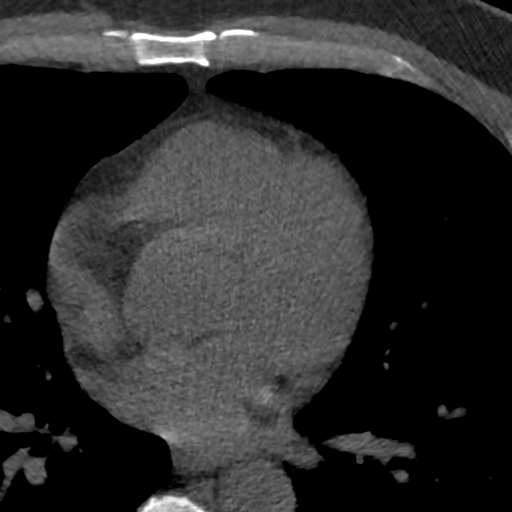
[im 32/42  vessel]
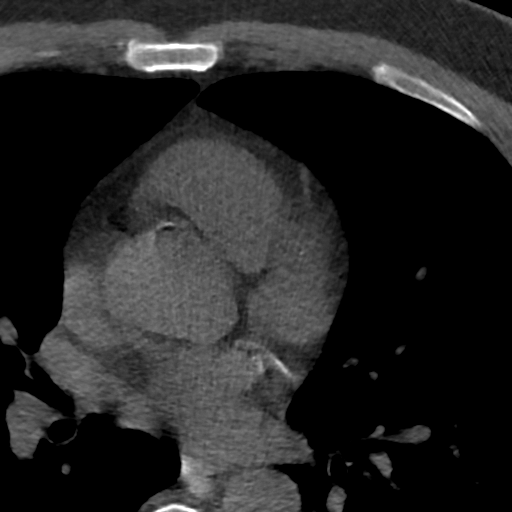
[im 37/42  vessel]
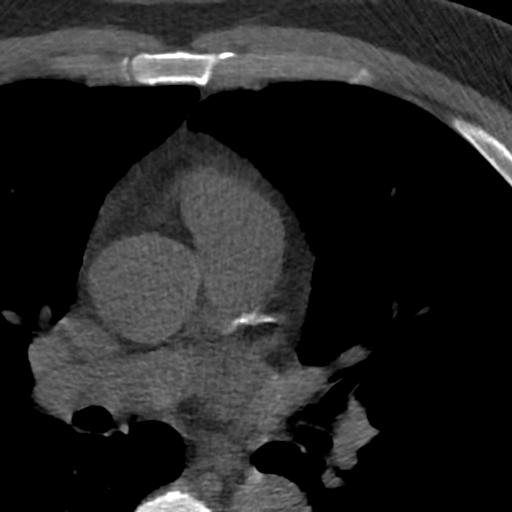

[Series 4: lung st 73 % · axial · 0.68mm/px · z∈[-232,-136]mm · 8 of 42 slices shown]
[im 5/42  lung]
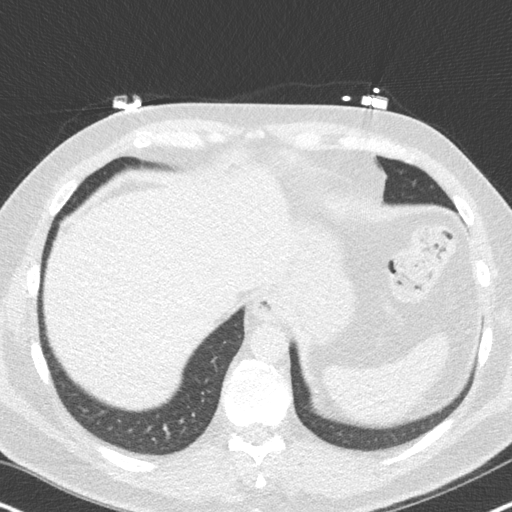
[im 10/42  lung]
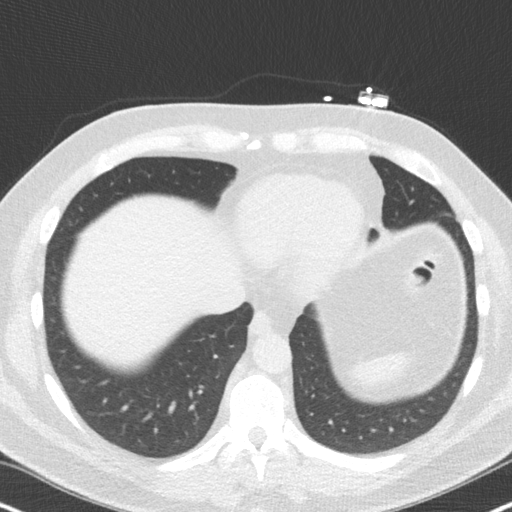
[im 14/42  lung]
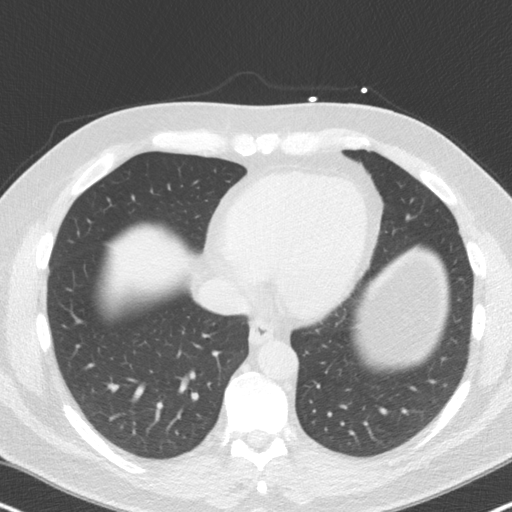
[im 19/42  lung]
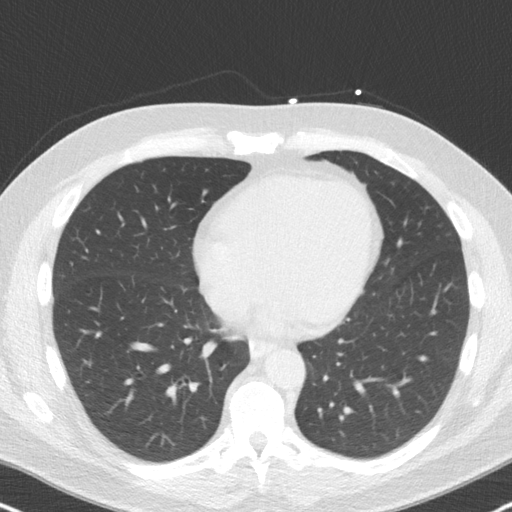
[im 23/42  lung]
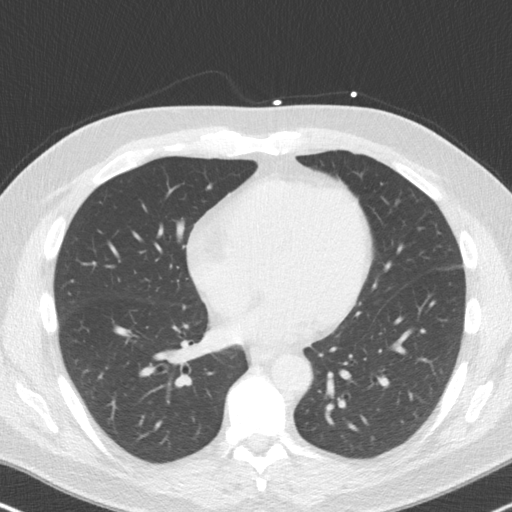
[im 28/42  lung]
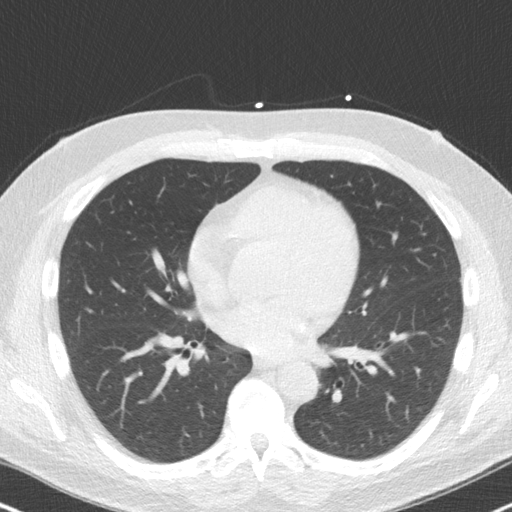
[im 32/42  lung]
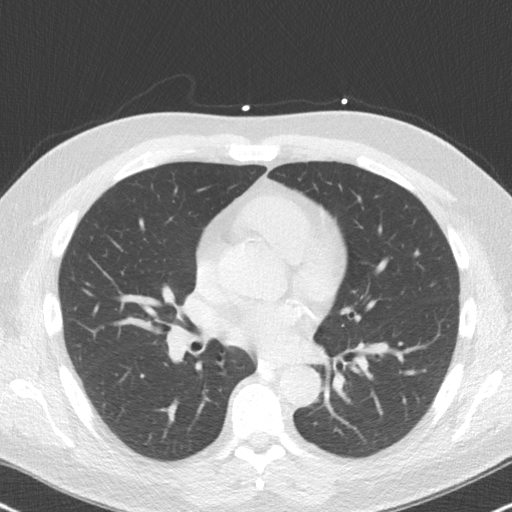
[im 37/42  lung]
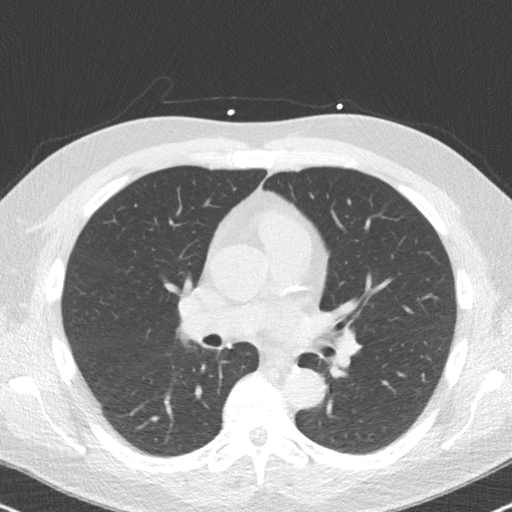

[16 of 20 positions shown; findings below may reference images not displayed]

FINDINGS: Non-cardiac: No significant non cardiac findings on limited lung and
soft tissue windows. See separate report from [REDACTED].

Ascending Aorta:  3.5 cm

Pericardium: Normal

Coronary arteries: Calcification of of all 3 major epicardial
coronary arteries especially the proximal and mid LAD
IMPRESSION: Coronary calcium score of 4666. This was 95th percentile for age and
sex matched control. Consider f/u perfusion study to further assess
risk

[REDACTED]AL DATA:  Family history of early coronary artery disease.
Hyperlipidemia.

EXAM:
OVER-READ INTERPRETATION  CT CHEST

The following report is an over-read performed by radiologist Dr.
over-read does not include interpretation of cardiac or coronary
anatomy or pathology. The coronary calcium score/coronary CTA
interpretation by the cardiologist is attached.

CT HEART FOR CALCIUM SCORING
FINDINGS: Mediastinum/Nodes: Unremarkable

Lungs/Pleura: Unremarkable

Upper abdomen: Unremarkable

Musculoskeletal: Thoracic spondylosis.
IMPRESSION: 1. Thoracic spondylosis. No additional significant extracardiac
findings.

## 2017-06-19 DIAGNOSIS — M7912 Myalgia of auxiliary muscles, head and neck: Secondary | ICD-10-CM | POA: Diagnosis not present

## 2017-06-19 DIAGNOSIS — M9903 Segmental and somatic dysfunction of lumbar region: Secondary | ICD-10-CM | POA: Diagnosis not present

## 2017-06-19 DIAGNOSIS — M9901 Segmental and somatic dysfunction of cervical region: Secondary | ICD-10-CM | POA: Diagnosis not present

## 2017-08-03 ENCOUNTER — Encounter: Payer: Self-pay | Admitting: Cardiovascular Disease

## 2017-08-09 NOTE — Progress Notes (Signed)
Patient ID: Ricardo Mccormick, male   DOB: 1956-04-06, 62 y.o.   MRN: [id number]    Cardiology Office Note  NEW PATIENT   Date:  08/14/2017   ID:  Ricardo Mccormick, DOB 1956-04-06, MRN [id number]  PCP:  Darrow BussingKoirala, Dibas, MD  Cardiologist:  NEW Dr. Eden EmmsNIshan    No chief complaint on file.     History of Present Illness:  62 y.o. history of HTN, DM, HLD and family history premature CAD. Father had MI age 61.     07/10/15 Calcium score 1070  With calcium in all 3 major arteries   Myovue done 07/22/15 normal with no ischemia EF 60%   08/09/16 ETT normal    He is taking aspirin and statin   No chest pain.  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 Arthritis of both knees 09/26/2013   Patellofemoral chondromalacia and medial joint line arthritis of the right knee. Posttraumatic arthritis of the left knee   . Diabetes mellitus without complication (HCC)   . HLD (hyperlipidemia)   . HTN (hypertension)     Past Surgical History:  Procedure Laterality Date  . COLONOSCOPY    . KNEE SURGERY Left    1980 &1992     Current Outpatient Medications  Medication Sig Dispense Refill  . Ascorbic Acid (VITAMIN C) 1000 MG tablet Take 1,000 mg by mouth daily.    Marland Kitchen. aspirin 81 MG tablet Take 81 mg by mouth daily.    Marland Kitchen. atorvastatin (LIPITOR) 40 MG tablet Take 40 mg by mouth daily.    Marland Kitchen. lisinopril (PRINIVIL,ZESTRIL) 10 MG tablet Take 10 mg by mouth daily.    . Omega-3 Fatty Acids (FISH OIL) 1200 MG CAPS Take 1 capsule by mouth daily.     Marland Kitchen. omeprazole (PRILOSEC) 20 MG capsule Take 20 mg by mouth every 3 (three) days.      No current facility-administered medications for this visit.     Allergies:   Latex    Social History:  The patient  reports that he has never smoked. He has never used  smokeless tobacco. He reports that he drinks about 0.6 oz of alcohol per week. He reports that he does not use drugs.   Family History:  The patient's family history includes Alzheimer's disease in his mother; Cancer in his mother; Diabetes in his maternal grandfather and sister; Epilepsy in his sister; Heart attack in his father.    ROS:  General:no colds or fevers, no weight changes Skin:no rashes or ulcers HEENT:no blurred vision, no congestion CV:see HPI PUL:see HPI GI:no diarrhea constipation or melena, no indigestion GU:no hematuria, no dysuria MS:no joint pain, no claudication Neuro:no syncope, no lightheadedness Endo:no diabetes, no thyroid disease  Wt Readings from Last 3 Encounters:  08/14/17 207 lb 9.6 oz (94.2 kg)  08/02/16 217 lb 4 oz (98.5 kg)  06/12/16 223 lb (101.2 kg)     PHYSICAL EXAM: VS:  BP 130/82   Pulse 82   Ht 5\' 10"  (1.778 m)   Wt 207 lb 9.6 oz (94.2 kg)   SpO2 97%   BMI 29.79 kg/m  , BMI  Affect appropriate Healthy:  appears stated age HEENT: normal Neck supple with no adenopathy JVP normal no bruits no thyromegaly Lungs clear with no wheezing and  good diaphragmatic motion Heart:  S1/S2 no murmur, no rub, gallop or click PMI normal Abdomen: benighn, BS positve, no tenderness, no AAA no bruit.  No HSM or HJR Distal pulses intact with no bruits No edema Neuro non-focal Skin warm and dry No muscular weakness   EKG:    07/10/15  SR possible Lt atrial enlargement.  No changes. 08/02/16  SR rate 84 normal ECG  08/14/17 SR rate 80 normal ECG    Recent Labs:07/07/15 Na140, K+ 4.3 LFTs were normal Cr 1.29  Bun 16 CPK 109 troponin<0.01  Lipid Panel 04/29/15  tg 127, tCHOL 147 hdl 45  LDL 76  Other studies Reviewed: Additional studies/ records that were reviewed today include: previous office notes. .Calcium Score 2017 myovue 2017 and ETT 2018    ASSESSMENT AND PLAN:  1. CAD: calcium score 1070 06/2015 with normal myovue March 2017 and normal  ETT March 2018 observe For now as he had lots of surgeries this past year and did fine 2. HLD: continue statin labs with primary 3. HTN:  Well controlled.  Continue current medications and low sodium Dash type diet.   4. Ortho:  Good result from TKR;s    [employer]

## 2017-08-14 ENCOUNTER — Encounter: Payer: Self-pay | Admitting: Cardiovascular Disease

## 2017-08-14 ENCOUNTER — Ambulatory Visit (INDEPENDENT_AMBULATORY_CARE_PROVIDER_SITE_OTHER): Payer: BLUE CROSS/BLUE SHIELD | Admitting: Cardiovascular Disease

## 2017-08-14 VITALS — BP 130/82 | HR 82 | Ht 70.0 in | Wt 207.6 lb

## 2017-08-14 DIAGNOSIS — I10 Essential (primary) hypertension: Secondary | ICD-10-CM

## 2017-08-14 NOTE — Patient Instructions (Signed)

## 2017-09-14 DIAGNOSIS — M9901 Segmental and somatic dysfunction of cervical region: Secondary | ICD-10-CM | POA: Diagnosis not present

## 2017-09-14 DIAGNOSIS — M7912 Myalgia of auxiliary muscles, head and neck: Secondary | ICD-10-CM | POA: Diagnosis not present

## 2017-11-17 DIAGNOSIS — E119 Type 2 diabetes mellitus without complications: Secondary | ICD-10-CM | POA: Diagnosis not present

## 2017-11-17 DIAGNOSIS — E7800 Pure hypercholesterolemia, unspecified: Secondary | ICD-10-CM | POA: Diagnosis not present

## 2017-11-17 DIAGNOSIS — I10 Essential (primary) hypertension: Secondary | ICD-10-CM | POA: Diagnosis not present

## 2018-01-16 DIAGNOSIS — K5792 Diverticulitis of intestine, part unspecified, without perforation or abscess without bleeding: Secondary | ICD-10-CM | POA: Diagnosis not present

## 2018-01-16 DIAGNOSIS — Z1211 Encounter for screening for malignant neoplasm of colon: Secondary | ICD-10-CM | POA: Diagnosis not present

## 2018-01-18 DIAGNOSIS — H524 Presbyopia: Secondary | ICD-10-CM | POA: Diagnosis not present

## 2018-03-01 DIAGNOSIS — Z96652 Presence of left artificial knee joint: Secondary | ICD-10-CM | POA: Diagnosis not present

## 2018-03-23 DIAGNOSIS — K5730 Diverticulosis of large intestine without perforation or abscess without bleeding: Secondary | ICD-10-CM | POA: Diagnosis not present

## 2018-03-23 DIAGNOSIS — Z1211 Encounter for screening for malignant neoplasm of colon: Secondary | ICD-10-CM | POA: Diagnosis not present

## 2018-06-05 DIAGNOSIS — I10 Essential (primary) hypertension: Secondary | ICD-10-CM | POA: Diagnosis not present

## 2018-06-05 DIAGNOSIS — E119 Type 2 diabetes mellitus without complications: Secondary | ICD-10-CM | POA: Diagnosis not present

## 2018-06-05 DIAGNOSIS — Z0000 Encounter for general adult medical examination without abnormal findings: Secondary | ICD-10-CM | POA: Diagnosis not present

## 2018-06-05 DIAGNOSIS — E7800 Pure hypercholesterolemia, unspecified: Secondary | ICD-10-CM | POA: Diagnosis not present

## 2018-06-05 DIAGNOSIS — Z23 Encounter for immunization: Secondary | ICD-10-CM | POA: Diagnosis not present

## 2018-08-06 DIAGNOSIS — Z23 Encounter for immunization: Secondary | ICD-10-CM | POA: Diagnosis not present

## 2018-08-07 ENCOUNTER — Telehealth: Payer: Self-pay

## 2018-08-07 DIAGNOSIS — R931 Abnormal findings on diagnostic imaging of heart and coronary circulation: Secondary | ICD-10-CM

## 2018-08-07 NOTE — Telephone Encounter (Signed)
Tried to call patient.  No answer.  Will try again later.

## 2018-08-07 NOTE — Telephone Encounter (Signed)
Scheduling rescheduled patient 3 months from now.

## 2018-08-07 NOTE — Telephone Encounter (Signed)
-----   Message from Wendall Stade, MD sent at 08/06/2018  5:39 PM EDT ----- Ok to cancel 4/1/ visit reschedule in 3 months will need ETT at that time as well

## 2018-08-15 ENCOUNTER — Ambulatory Visit: Payer: BLUE CROSS/BLUE SHIELD | Admitting: Cardiovascular Disease

## 2018-09-10 DIAGNOSIS — M791 Myalgia, unspecified site: Secondary | ICD-10-CM | POA: Diagnosis not present

## 2018-09-10 DIAGNOSIS — M9903 Segmental and somatic dysfunction of lumbar region: Secondary | ICD-10-CM | POA: Diagnosis not present

## 2018-09-10 DIAGNOSIS — M9902 Segmental and somatic dysfunction of thoracic region: Secondary | ICD-10-CM | POA: Diagnosis not present

## 2018-09-19 NOTE — Telephone Encounter (Signed)
Will need to schedule ETT when patient's are able to do them back in the office.

## 2018-10-04 DIAGNOSIS — M9902 Segmental and somatic dysfunction of thoracic region: Secondary | ICD-10-CM | POA: Diagnosis not present

## 2018-10-04 DIAGNOSIS — M9903 Segmental and somatic dysfunction of lumbar region: Secondary | ICD-10-CM | POA: Diagnosis not present

## 2018-10-23 DIAGNOSIS — M7989 Other specified soft tissue disorders: Secondary | ICD-10-CM | POA: Diagnosis not present

## 2018-10-23 DIAGNOSIS — D2122 Benign neoplasm of connective and other soft tissue of left lower limb, including hip: Secondary | ICD-10-CM | POA: Diagnosis not present

## 2018-10-23 DIAGNOSIS — M7138 Other bursal cyst, other site: Secondary | ICD-10-CM | POA: Diagnosis not present

## 2018-10-31 NOTE — Telephone Encounter (Signed)
Will send message to monitor techs to put on waiting list.

## 2018-11-02 NOTE — Progress Notes (Signed)
Patient ID: Ricardo Mccormick, male   DOB: [birth year], 63 y.o.   MRN: [id number]    Cardiology Office Note  NEW PATIENT   Date:  11/02/2018   ID:  Ricardo Mccormick, DOB [birth year], MRN [id number]  PCP:  Lujean Amel, MD  Cardiologist:  NEW Dr. Johnsie Cancel    No chief complaint on file.     History of Present Illness:  63 y.o. history of HTN, DM, HLD and family history premature CAD. Father had MI age 17.     07/10/15 Calcium score 1070  With calcium in all 3 major arteries   Myovue done 07/22/15 normal with no ischemia EF 60%   08/09/16 ETT normal    He is taking aspirin and statin   No chest pain.  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 Arthritis of both knees 09/26/2013   Patellofemoral chondromalacia and medial joint line arthritis of the right knee. Posttraumatic arthritis of the left knee   . Diabetes mellitus without complication ([place])   . HLD (hyperlipidemia)   . HTN (hypertension)     Past Surgical History:  Procedure Laterality Date  . COLONOSCOPY    . KNEE SURGERY Left    1980 &1992     Current Outpatient Medications  Medication Sig Dispense Refill  . Ascorbic Acid (VITAMIN C) 1000 MG tablet Take 1,000 mg by mouth daily.    Marland Kitchen aspirin 81 MG tablet Take 81 mg by mouth daily.    Marland Kitchen atorvastatin (LIPITOR) 40 MG tablet Take 40 mg by mouth daily.    Marland Kitchen lisinopril (PRINIVIL,ZESTRIL) 10 MG tablet Take 10 mg by mouth daily.    . Omega-3 Fatty Acids (FISH OIL) 1200 MG CAPS Take 1 capsule by mouth daily.     Marland Kitchen omeprazole (PRILOSEC) 20 MG capsule Take 20 mg by mouth every 3 (three) days.      No current facility-administered medications for this visit.     Allergies:   Latex     Social History:  The patient  reports that he has never smoked. He has never used smokeless tobacco. He reports current alcohol use of about 1.0 standard drinks of alcohol per week. He reports that he does not use drugs.   Family History:  The patient's family history includes Alzheimer's disease in his mother; Cancer in his mother; Diabetes in his maternal grandfather and sister; Epilepsy in his sister; Heart attack in his father.    ROS:  General:no colds or fevers, no weight changes Skin:no rashes or ulcers HEENT:no blurred vision, no congestion CV:see HPI PUL:see HPI GI:no diarrhea constipation or melena, no indigestion GU:no hematuria, no dysuria MS:no joint pain, no claudication Neuro:no syncope, no lightheadedness Endo:no diabetes, no thyroid disease  Wt Readings from Last 3 Encounters:  08/14/17 94.2 kg  08/02/16 98.5 kg  06/12/16 101.2 kg     PHYSICAL EXAM:  No distress Skin warm and dry  No tachypnea No edema No JVP  Elevation   EKG:    07/10/15  SR possible Lt atrial enlargement.  No changes. 08/02/16  SR rate 84 normal ECG  08/14/17 SR rate 80 normal ECG  Recent Labs:07/07/15 Na140, K+ 4.3 LFTs were normal Cr 1.29  Bun 16 CPK 109 troponin<0.01  Lipid Panel 04/29/15  tg 127, tCHOL 147 hdl 45  LDL 76  Other studies Reviewed: Additional studies/ records that were reviewed today include: previous office notes. .Calcium Score 2017 myovue 2017 and ETT 2018    ASSESSMENT AND PLAN:  1. CAD: calcium score 1070 06/2015 with normal myovue March 2017 and normal ETT March 2018 will order f/u ETT when COVID 19 restrictions lifted  2. HTN:  Well controlled.  Continue current medications and low sodium Dash type diet.   3. Ortho:  Good result from TKR;s    [employer] Samhita Kretsch

## 2018-11-05 ENCOUNTER — Telehealth: Payer: Self-pay

## 2018-11-05 NOTE — Telephone Encounter (Signed)
Virtual Visit Pre-Appointment Phone Call  "(Name), I am calling you today to discuss your upcoming appointment. We are currently trying to limit exposure to the virus that causes COVID-19 by seeing patients at home rather than in the office."  1. "What is the BEST phone number to call the day of the visit?" - include this in appointment notes  2. "Do you have or have access to (through a family member/friend) a smartphone with video capability that we can use for your visit?" a. If yes - list this number in appt notes as "cell" (if different from BEST phone #) and list the appointment type as a VIDEO visit in appointment notes b. If no - list the appointment type as a PHONE visit in appointment notes  3. Confirm consent - "In the setting of the current Covid19 crisis, you are scheduled for a (phone or video) visit with your provider on (date) at (time).  Just as we do with many in-office visits, in order for you to participate in this visit, we must obtain consent.  If you'd like, I can send this to your mychart (if signed up) or email for you to review.  Otherwise, I can obtain your verbal consent now.  All virtual visits are billed to your insurance company just like a normal visit would be.  By agreeing to a virtual visit, we'd like you to understand that the technology does not allow for your provider to perform an examination, and thus may limit your provider's ability to fully assess your condition. If your provider identifies any concerns that need to be evaluated in person, we will make arrangements to do so.  Finally, though the technology is pretty good, we cannot assure that it will always work on either your or our end, and in the setting of a video visit, we may have to convert it to a phone-only visit.  In either situation, we cannot ensure that we have a secure connection.  Are you willing to proceed? YES  4. Advise patient to be prepared - "Two hours prior to your appointment, go  ahead and check your blood pressure, pulse, oxygen saturation, and your weight (if you have the equipment to check those) and write them all down. When your visit starts, your provider will ask you for this information. If you have an Apple Watch or Kardia device, please plan to have heart rate information ready on the day of your appointment. Please have a pen and paper handy nearby the day of the visit as well."  5. Give patient instructions for MyChart download to smartphone OR Doximity/[email or web address].me as below if video visit (depending on what platform provider is using)  6. Inform patient they will receive a phone call 15 minutes prior to their appointment time (may be from unknown caller ID) so they should be prepared to answer    TELEPHONE CALL NOTE  Ricardo Mccormick has been deemed a candidate for a follow-up tele-health visit to limit community exposure during the Covid-19 pandemic. I spoke with the patient via phone to ensure availability of phone/video source, confirm preferred email & phone number, and discuss instructions and expectations.  I reminded Ricardo Mccormick to be prepared with any vital sign and/or heart rhythm information that could potentially be obtained via home monitoring, at the time of his visit. I reminded Ricardo Mccormick to expect a phone call prior to his visit.  Michaelyn Barter, RN 11/05/2018 1:26 PM   IF USING DOXIMITY or  DOXY.ME - The patient will receive a link just prior to their visit by text.     FULL LENGTH CONSENT FOR TELE-HEALTH VISIT   I hereby voluntarily request, consent and authorize [facility] and its employed or contracted physicians, physician assistants, nurse practitioners or other licensed health care professionals (the Practitioner), to provide me with telemedicine health care services (the "Services") as deemed necessary by the treating Practitioner. I acknowledge and consent to receive the Services by the Practitioner via telemedicine. I understand  that the telemedicine visit will involve communicating with the Practitioner through live audiovisual communication technology and the disclosure of certain medical information by electronic transmission. I acknowledge that I have been given the opportunity to request an in-person assessment or other available alternative prior to the telemedicine visit and am voluntarily participating in the telemedicine visit.  I understand that I have the right to withhold or withdraw my consent to the use of telemedicine in the course of my care at any time, without affecting my right to future care or treatment, and that the Practitioner or I may terminate the telemedicine visit at any time. I understand that I have the right to inspect all information obtained and/or recorded in the course of the telemedicine visit and may receive copies of available information for a reasonable fee.  I understand that some of the potential risks of receiving the Services via telemedicine include:  Marland Kitchen. Delay or interruption in medical evaluation due to technological equipment failure or disruption; . Information transmitted may not be sufficient (e.g. poor resolution of images) to allow for appropriate medical decision making by the Practitioner; and/or  . In rare instances, security protocols could fail, causing a breach of personal health information.  Furthermore, I acknowledge that it is my responsibility to provide information about my medical history, conditions and care that is complete and accurate to the best of my ability. I acknowledge that Practitioner's advice, recommendations, and/or decision may be based on factors not within their control, such as incomplete or inaccurate data provided by me or distortions of diagnostic images or specimens that may result from electronic transmissions. I understand that the practice of medicine is not an exact science and that Practitioner makes no warranties or guarantees regarding  treatment outcomes. I acknowledge that I will receive a copy of this consent concurrently upon execution via email to the email address I last provided but may also request a printed copy by calling the office of [facility].    I understand that my insurance will be billed for this visit.   I have read or had this consent read to me. . I understand the contents of this consent, which adequately explains the benefits and risks of the Services being provided via telemedicine.  . I have been provided ample opportunity to ask questions regarding this consent and the Services and have had my questions answered to my satisfaction. . I give my informed consent for the services to be provided through the use of telemedicine in my medical care  By participating in this telemedicine visit I agree to the above.

## 2018-11-12 ENCOUNTER — Encounter: Payer: Self-pay | Admitting: Cardiovascular Disease

## 2018-11-12 ENCOUNTER — Telehealth (INDEPENDENT_AMBULATORY_CARE_PROVIDER_SITE_OTHER): Payer: BC Managed Care – PPO | Admitting: Cardiovascular Disease

## 2018-11-12 ENCOUNTER — Other Ambulatory Visit: Payer: Self-pay

## 2018-11-12 VITALS — BP 162/102 | HR 102 | Ht 70.0 in | Wt 208.0 lb

## 2018-11-12 DIAGNOSIS — R931 Abnormal findings on diagnostic imaging of heart and coronary circulation: Secondary | ICD-10-CM | POA: Diagnosis not present

## 2018-11-12 DIAGNOSIS — I2510 Atherosclerotic heart disease of native coronary artery without angina pectoris: Secondary | ICD-10-CM

## 2018-11-12 NOTE — Patient Instructions (Addendum)
Medication Instructions:  Your physician recommends that you continue on your current medications as directed. Please refer to the Current Medication list given to you today.  If you need a refill on your cardiac medications before your next appointment, please call your pharmacy.    Lab work: None Ordered   Testing/Procedures: Your physician has requested that you have an exercise tolerance test. For further information please visit [email or web address]. Please also follow instruction sheet, as given. We will call you to schedule.    Follow-Up: At [facility], you and your health needs are our priority.  As part of our continuing mission to provide you with exceptional heart care, we have created designated Provider Care Teams.  These Care Teams include your primary Cardiologist (physician) and Advanced Practice Providers (APPs -  Physician Assistants and Nurse Practitioners) who all work together to provide you with the care you need, when you need it. You will need a follow up appointment in 1 year. Please call our office 2 months prior to schedule an appointment. You may see Dr. Johnsie Cancel or one of the following Advanced Practice Providers on your designated Care Team:   Truitt Merle, NP Cecilie Kicks, NP . Kathyrn Drown, NP

## 2018-12-04 DIAGNOSIS — E119 Type 2 diabetes mellitus without complications: Secondary | ICD-10-CM | POA: Diagnosis not present

## 2018-12-04 DIAGNOSIS — I10 Essential (primary) hypertension: Secondary | ICD-10-CM | POA: Diagnosis not present

## 2018-12-04 DIAGNOSIS — E7800 Pure hypercholesterolemia, unspecified: Secondary | ICD-10-CM | POA: Diagnosis not present

## 2018-12-04 DIAGNOSIS — E785 Hyperlipidemia, unspecified: Secondary | ICD-10-CM | POA: Diagnosis not present

## 2018-12-12 DIAGNOSIS — E119 Type 2 diabetes mellitus without complications: Secondary | ICD-10-CM | POA: Diagnosis not present

## 2019-02-14 DIAGNOSIS — M9902 Segmental and somatic dysfunction of thoracic region: Secondary | ICD-10-CM | POA: Diagnosis not present

## 2019-02-14 DIAGNOSIS — M9903 Segmental and somatic dysfunction of lumbar region: Secondary | ICD-10-CM | POA: Diagnosis not present

## 2019-03-05 DIAGNOSIS — H524 Presbyopia: Secondary | ICD-10-CM | POA: Diagnosis not present

## 2019-04-23 DIAGNOSIS — M5136 Other intervertebral disc degeneration, lumbar region: Secondary | ICD-10-CM | POA: Diagnosis not present

## 2019-04-23 DIAGNOSIS — M545 Low back pain: Secondary | ICD-10-CM | POA: Diagnosis not present

## 2019-05-07 ENCOUNTER — Telehealth: Payer: Self-pay | Admitting: Radiology

## 2019-05-07 NOTE — Telephone Encounter (Signed)
Yes but can be done anytime in next 3 months

## 2019-05-07 NOTE — Telephone Encounter (Signed)
Exercise treadmill test was ordered back in May and June 2020  when treadmill room was shut down due to Covid. Please advise if patient still needs test scheduled.  If no longer needed please cancel the order

## 2019-06-11 DIAGNOSIS — Z125 Encounter for screening for malignant neoplasm of prostate: Secondary | ICD-10-CM | POA: Diagnosis not present

## 2019-06-11 DIAGNOSIS — I10 Essential (primary) hypertension: Secondary | ICD-10-CM | POA: Diagnosis not present

## 2019-06-11 DIAGNOSIS — E7800 Pure hypercholesterolemia, unspecified: Secondary | ICD-10-CM | POA: Diagnosis not present

## 2019-06-11 DIAGNOSIS — Z13220 Encounter for screening for lipoid disorders: Secondary | ICD-10-CM | POA: Diagnosis not present

## 2019-06-11 DIAGNOSIS — Z0000 Encounter for general adult medical examination without abnormal findings: Secondary | ICD-10-CM | POA: Diagnosis not present

## 2019-06-11 DIAGNOSIS — E119 Type 2 diabetes mellitus without complications: Secondary | ICD-10-CM | POA: Diagnosis not present

## 2019-07-17 DIAGNOSIS — M791 Myalgia, unspecified site: Secondary | ICD-10-CM | POA: Diagnosis not present

## 2019-07-17 DIAGNOSIS — M5136 Other intervertebral disc degeneration, lumbar region: Secondary | ICD-10-CM | POA: Diagnosis not present

## 2019-07-17 DIAGNOSIS — M9903 Segmental and somatic dysfunction of lumbar region: Secondary | ICD-10-CM | POA: Diagnosis not present

## 2019-07-18 DIAGNOSIS — M791 Myalgia, unspecified site: Secondary | ICD-10-CM | POA: Diagnosis not present

## 2019-07-18 DIAGNOSIS — M9903 Segmental and somatic dysfunction of lumbar region: Secondary | ICD-10-CM | POA: Diagnosis not present

## 2019-08-16 ENCOUNTER — Other Ambulatory Visit (HOSPITAL_COMMUNITY)
Admission: RE | Admit: 2019-08-16 | Discharge: 2019-08-16 | Disposition: A | Discharge status: Home / Self Care | Payer: BC Managed Care – PPO | Source: Ambulatory Visit | Attending: Cardiovascular Disease | Admitting: Cardiovascular Disease

## 2019-08-16 DIAGNOSIS — Z01812 Encounter for preprocedural laboratory examination: Secondary | ICD-10-CM | POA: Insufficient documentation

## 2019-08-16 DIAGNOSIS — Z20822 Contact with and (suspected) exposure to covid-19: Secondary | ICD-10-CM | POA: Diagnosis not present

## 2019-08-16 LAB — SARS CORONAVIRUS 2 (TAT 6-24 HRS): SARS Coronavirus 2: NEGATIVE

## 2019-08-20 ENCOUNTER — Ambulatory Visit (INDEPENDENT_AMBULATORY_CARE_PROVIDER_SITE_OTHER): Payer: BC Managed Care – PPO

## 2019-08-20 ENCOUNTER — Other Ambulatory Visit: Payer: Self-pay

## 2019-08-20 DIAGNOSIS — R931 Abnormal findings on diagnostic imaging of heart and coronary circulation: Secondary | ICD-10-CM

## 2019-08-20 LAB — EXERCISE TOLERANCE TEST
Estimated workload: 10.1 METS
Exercise duration (min): 7 min
Exercise duration (sec): 0 s
MPHR: 158 {beats}/min
Peak HR: 164 {beats}/min
Percent HR: 103 %
RPE: 17
Rest HR: 100 {beats}/min

## 2019-09-10 DIAGNOSIS — E119 Type 2 diabetes mellitus without complications: Secondary | ICD-10-CM | POA: Diagnosis not present

## 2019-10-03 DIAGNOSIS — M791 Myalgia, unspecified site: Secondary | ICD-10-CM | POA: Diagnosis not present

## 2019-10-03 DIAGNOSIS — M9903 Segmental and somatic dysfunction of lumbar region: Secondary | ICD-10-CM | POA: Diagnosis not present

## 2019-10-09 DIAGNOSIS — M791 Myalgia, unspecified site: Secondary | ICD-10-CM | POA: Diagnosis not present

## 2019-10-09 DIAGNOSIS — M9903 Segmental and somatic dysfunction of lumbar region: Secondary | ICD-10-CM | POA: Diagnosis not present

## 2019-11-13 DIAGNOSIS — M9903 Segmental and somatic dysfunction of lumbar region: Secondary | ICD-10-CM | POA: Diagnosis not present

## 2019-12-05 NOTE — Progress Notes (Signed)
Patient ID: Ricardo Mccormick, male   DOB: 12-30-1955, 64 y.o.   MRN: [id number]    Cardiology Office Note  NEW PATIENT   Date:  12/06/2019   ID:  Ricardo Mccormick, DOB 04/18/1956, MRN [id number]  PCP:  Darrow Bussing, MD  Cardiologist:  NEW Dr. Eden Emms    No chief complaint on file.     History of Present Illness:  64 y.o. history of HTN, DM, HLD and family history premature CAD. Father had MI age 47.     07/10/15 Calcium score 1070  With calcium in all 3 major arteries   Myovue done 07/22/15 normal with no ischemia EF 60%   08/09/16 ETT normal  08/20/19   ETT normal    He is taking aspirin and statin   No chest pain.  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 Arthritis of both knees 09/26/2013   Patellofemoral chondromalacia and medial joint line arthritis of the right knee. Posttraumatic arthritis of the left knee   . Diabetes mellitus without complication (HCC)   . HLD (hyperlipidemia)   . HTN (hypertension)     Past Surgical History:  Procedure Laterality Date  . COLONOSCOPY    . KNEE SURGERY Left    1980 &1992     Current Outpatient Medications  Medication Sig Dispense Refill  . Accu-Chek FastClix Lancets MISC Apply 1 each topically as needed.    Marland Kitchen ACCU-CHEK GUIDE test strip 1 each by Other route as needed.    Marland Kitchen aspirin 81 MG tablet Take 81 mg by mouth daily.    Marland Kitchen atorvastatin (LIPITOR) 40 MG tablet Take 40 mg by mouth daily.    . Blood Glucose Monitoring Suppl (ACCU-CHEK GUIDE) w/Device KIT 1 each by Other route as needed.    Marland Kitchen ibuprofen (ADVIL) 800 MG tablet Take 1 tablet by mouth every 6 (six) hours as needed.    Marland Kitchen lisinopril (PRINIVIL,ZESTRIL) 10 MG tablet Take 5 mg by mouth  daily.     . Omega-3 Fatty Acids (FISH OIL) 1200 MG CAPS Take 1 capsule by mouth daily.      No current facility-administered medications for this visit.    Allergies:   Latex    Social History:  The patient  reports that he has never smoked. He has never used smokeless tobacco. He reports current alcohol use of about 1.0 standard drink of alcohol per week. He reports that he does not use drugs.   Family History:  The patient's family history includes Alzheimer's disease in his mother; Cancer in his mother; Diabetes in his maternal grandfather and sister; Epilepsy in his sister; Heart attack in his father.    ROS:  General:no colds or fevers, no weight changes Skin:no rashes or ulcers HEENT:no blurred vision, no congestion CV:see HPI PUL:see HPI GI:no diarrhea constipation or melena, no indigestion GU:no hematuria, no dysuria MS:no joint pain, no claudication Neuro:no syncope, no lightheadedness Endo:no diabetes, no thyroid disease  Wt Readings from Last 3 Encounters:  12/06/19 (!) 205 lb (93 kg)  11/12/18 208 lb (94.3 kg)  08/14/17 207 lb 9.6 oz (94.2 kg)     PHYSICAL EXAM:  No distress  Skin warm and dry  No tachypnea No edema No JVP  Elevation   EKG:    07/10/15  SR possible Lt atrial enlargement.  No changes. 08/02/16  SR rate 84 normal ECG  08/14/17 SR rate 80 normal ECG    Recent Labs:07/07/15 Na140, K+ 4.3 LFTs were normal Cr 1.29  Bun 16 CPK 109 troponin<0.01  Lipid Panel 04/29/15  tg 127, tCHOL 147 hdl 45  LDL 76  Other studies Reviewed: Additional studies/ records that were reviewed today include: previous office notes. .Calcium Score 2017 myovue 2017 and ETT 2018    ASSESSMENT AND PLAN:  1. CAD: calcium score 1070 06/2015 with normal myovue March 2017 and normal ETT March 2018 and normal ETT 08/20/19 continue medical Rx  2. HTN:  Well controlled.  Continue current medications and low sodium Dash type diet.   3. Ortho:  Good result from TKR;s   F/U in a  year  Ricardo Mccormick

## 2019-12-06 ENCOUNTER — Other Ambulatory Visit: Payer: Self-pay

## 2019-12-06 ENCOUNTER — Ambulatory Visit (INDEPENDENT_AMBULATORY_CARE_PROVIDER_SITE_OTHER): Payer: BC Managed Care – PPO | Admitting: Cardiovascular Disease

## 2019-12-06 ENCOUNTER — Encounter: Payer: Self-pay | Admitting: Cardiovascular Disease

## 2019-12-06 VITALS — BP 100/68 | HR 82 | Ht 70.0 in | Wt 205.0 lb

## 2019-12-06 DIAGNOSIS — I2510 Atherosclerotic heart disease of native coronary artery without angina pectoris: Secondary | ICD-10-CM | POA: Diagnosis not present

## 2019-12-06 NOTE — Patient Instructions (Signed)
Medication Instructions:  *If you need a refill on your cardiac medications before your next appointment, please call your pharmacy*  Lab Work: If you have labs (blood work) drawn today and your tests are completely normal, you will receive your results only by: Marland Kitchen MyChart Message (if you have MyChart) OR . A paper copy in the mail If you have any lab test that is abnormal or we need to change your treatment, we will call you to review the results.  Testing/Procedures: Your physician has requested that you have an exercise tolerance test in April . For further information please visit [email or web address] Please also follow instruction sheet, as given.  Follow-Up: At [facility], you and your health needs are our priority.  As part of our continuing mission to provide you with exceptional heart care, we have created designated Provider Care Teams.  These Care Teams include your primary Cardiologist (physician) and Advanced Practice Providers (APPs -  Physician Assistants and Nurse Practitioners) who all work together to provide you with the care you need, when you need it.  We recommend signing up for the patient portal called "MyChart".  Sign up information is provided on this After Visit Summary.  MyChart is used to connect with patients for Virtual Visits (Telemedicine).  Patients are able to view lab/test results, encounter notes, upcoming appointments, etc.  Non-urgent messages can be sent to your provider as well.   To learn more about what you can do with MyChart, go to [email or web address].    Your next appointment:   9 month(s)  The format for your next appointment:   In Person  Provider:   You may see Dr. Eden Emms or one of the following Advanced Practice Providers on your designated Care Team:    Norma Fredrickson, NP  Nada Boozer, NP  Georgie Chard, NP

## 2019-12-09 DIAGNOSIS — M9903 Segmental and somatic dysfunction of lumbar region: Secondary | ICD-10-CM | POA: Diagnosis not present

## 2019-12-10 DIAGNOSIS — I10 Essential (primary) hypertension: Secondary | ICD-10-CM | POA: Diagnosis not present

## 2019-12-10 DIAGNOSIS — E119 Type 2 diabetes mellitus without complications: Secondary | ICD-10-CM | POA: Diagnosis not present

## 2019-12-10 DIAGNOSIS — E7800 Pure hypercholesterolemia, unspecified: Secondary | ICD-10-CM | POA: Diagnosis not present

## 2020-01-08 DIAGNOSIS — R319 Hematuria, unspecified: Secondary | ICD-10-CM | POA: Diagnosis not present

## 2020-01-09 ENCOUNTER — Other Ambulatory Visit: Payer: Self-pay | Admitting: Family Medicine

## 2020-01-09 DIAGNOSIS — R319 Hematuria, unspecified: Secondary | ICD-10-CM

## 2020-01-21 ENCOUNTER — Other Ambulatory Visit: Payer: BC Managed Care – PPO

## 2020-01-22 DIAGNOSIS — M9903 Segmental and somatic dysfunction of lumbar region: Secondary | ICD-10-CM | POA: Diagnosis not present

## 2020-02-27 DIAGNOSIS — M9903 Segmental and somatic dysfunction of lumbar region: Secondary | ICD-10-CM | POA: Diagnosis not present

## 2020-03-02 DIAGNOSIS — M9903 Segmental and somatic dysfunction of lumbar region: Secondary | ICD-10-CM | POA: Diagnosis not present

## 2020-03-10 DIAGNOSIS — Z20822 Contact with and (suspected) exposure to covid-19: Secondary | ICD-10-CM | POA: Diagnosis not present

## 2020-04-19 DIAGNOSIS — Z48810 Encounter for surgical aftercare following surgery on the sense organs: Secondary | ICD-10-CM | POA: Diagnosis not present

## 2020-04-19 DIAGNOSIS — H43813 Vitreous degeneration, bilateral: Secondary | ICD-10-CM | POA: Diagnosis not present

## 2020-04-19 DIAGNOSIS — H33011 Retinal detachment with single break, right eye: Secondary | ICD-10-CM | POA: Diagnosis not present

## 2020-04-19 DIAGNOSIS — H31092 Other chorioretinal scars, left eye: Secondary | ICD-10-CM | POA: Diagnosis not present

## 2020-04-27 DIAGNOSIS — H33011 Retinal detachment with single break, right eye: Secondary | ICD-10-CM | POA: Diagnosis not present

## 2020-04-30 DIAGNOSIS — M9903 Segmental and somatic dysfunction of lumbar region: Secondary | ICD-10-CM | POA: Diagnosis not present

## 2020-05-13 DIAGNOSIS — H43811 Vitreous degeneration, right eye: Secondary | ICD-10-CM | POA: Diagnosis not present

## 2020-05-13 DIAGNOSIS — H35372 Puckering of macula, left eye: Secondary | ICD-10-CM | POA: Diagnosis not present

## 2020-05-20 DIAGNOSIS — L821 Other seborrheic keratosis: Secondary | ICD-10-CM | POA: Diagnosis not present

## 2020-05-20 DIAGNOSIS — L820 Inflamed seborrheic keratosis: Secondary | ICD-10-CM | POA: Diagnosis not present

## 2020-05-20 DIAGNOSIS — L570 Actinic keratosis: Secondary | ICD-10-CM | POA: Diagnosis not present

## 2020-05-20 DIAGNOSIS — L578 Other skin changes due to chronic exposure to nonionizing radiation: Secondary | ICD-10-CM | POA: Diagnosis not present

## 2020-05-27 DIAGNOSIS — H31091 Other chorioretinal scars, right eye: Secondary | ICD-10-CM | POA: Diagnosis not present

## 2020-06-15 DIAGNOSIS — Z125 Encounter for screening for malignant neoplasm of prostate: Secondary | ICD-10-CM | POA: Diagnosis not present

## 2020-06-15 DIAGNOSIS — R319 Hematuria, unspecified: Secondary | ICD-10-CM | POA: Diagnosis not present

## 2020-06-15 DIAGNOSIS — I10 Essential (primary) hypertension: Secondary | ICD-10-CM | POA: Diagnosis not present

## 2020-06-15 DIAGNOSIS — E7800 Pure hypercholesterolemia, unspecified: Secondary | ICD-10-CM | POA: Diagnosis not present

## 2020-06-15 DIAGNOSIS — E119 Type 2 diabetes mellitus without complications: Secondary | ICD-10-CM | POA: Diagnosis not present

## 2020-06-15 DIAGNOSIS — Z0000 Encounter for general adult medical examination without abnormal findings: Secondary | ICD-10-CM | POA: Diagnosis not present

## 2020-06-18 DIAGNOSIS — M5417 Radiculopathy, lumbosacral region: Secondary | ICD-10-CM | POA: Diagnosis not present

## 2020-06-18 DIAGNOSIS — M791 Myalgia, unspecified site: Secondary | ICD-10-CM | POA: Diagnosis not present

## 2020-06-18 DIAGNOSIS — M9903 Segmental and somatic dysfunction of lumbar region: Secondary | ICD-10-CM | POA: Diagnosis not present

## 2020-07-08 DIAGNOSIS — H31091 Other chorioretinal scars, right eye: Secondary | ICD-10-CM | POA: Diagnosis not present

## 2020-07-08 DIAGNOSIS — H33011 Retinal detachment with single break, right eye: Secondary | ICD-10-CM | POA: Diagnosis not present

## 2020-07-16 DIAGNOSIS — M9903 Segmental and somatic dysfunction of lumbar region: Secondary | ICD-10-CM | POA: Diagnosis not present

## 2020-07-16 DIAGNOSIS — M791 Myalgia, unspecified site: Secondary | ICD-10-CM | POA: Diagnosis not present

## 2020-08-31 ENCOUNTER — Other Ambulatory Visit (HOSPITAL_COMMUNITY): Payer: BC Managed Care – PPO

## 2020-08-31 ENCOUNTER — Other Ambulatory Visit (HOSPITAL_COMMUNITY)
Admission: RE | Admit: 2020-08-31 | Discharge: 2020-08-31 | Disposition: A | Discharge status: Home / Self Care | Payer: BC Managed Care – PPO | Source: Ambulatory Visit | Attending: Family Medicine | Admitting: Family Medicine

## 2020-08-31 DIAGNOSIS — Z01812 Encounter for preprocedural laboratory examination: Secondary | ICD-10-CM | POA: Diagnosis not present

## 2020-08-31 DIAGNOSIS — Z20822 Contact with and (suspected) exposure to covid-19: Secondary | ICD-10-CM | POA: Insufficient documentation

## 2020-08-31 NOTE — Progress Notes (Deleted)
Patient ID: Ricardo Mccormick, male   DOB: 04/22/1956, 64 y.o.   MRN: [id number]    Virtual Visit via Video Note   This visit type was conducted due to national recommendations for restrictions regarding the COVID-19 Pandemic (e.g. social distancing) in an effort to limit this patient's exposure and mitigate transmission in our community.  Due to her co-morbid illnesses, this patient is at least at moderate risk for complications without adequate follow up.  This format is felt to be most appropriate for this patient at this time.  All issues noted in this document were discussed and addressed.  A limited physical exam was performed with this format.  Please refer to the patient's chart for her consent to telehealth for [facility].   Patient Location Home Physician Location Office   Date:  08/31/2020   ID:  Ricardo Mccormick, DOB 10/12/1955, MRN [id number]  PCP:  Koirala, Dibas, MD  Cardiologist:  Nishan    History of Present Illness:  64 y.o. history of HTN, DM, HLD and family history premature CAD. Father had MI age 53.     07/10/15 Calcium score 1070  With calcium in all 3 major arteries   Myovue done 07/22/15 normal with no ischemia EF 60%   08/09/16 ETT normal  08/20/19   ETT normal   He is taking aspirin and statin   No chest pain.  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 Arthritis of both knees 09/26/2013   Patellofemoral chondromalacia and medial joint line arthritis of the right knee. Posttraumatic arthritis of the left knee   . Diabetes mellitus without complication (HCC)   . HLD (hyperlipidemia)   . HTN (hypertension)     Past Surgical History:  Procedure Laterality Date  . COLONOSCOPY    . KNEE SURGERY Left    1980 &1992     Current Outpatient  Medications  Medication Sig Dispense Refill  . Accu-Chek FastClix Lancets MISC Apply 1 each topically as needed.    . ACCU-CHEK GUIDE test strip 1 each by Other route as needed.    . aspirin 81 MG tablet Take 81 mg by mouth daily.    . atorvastatin (LIPITOR) 40 MG tablet Take 40 mg by mouth daily.    . Blood Glucose Monitoring Suppl (ACCU-CHEK GUIDE) w/Device KIT 1 each by Other route as needed.    . ibuprofen (ADVIL) 800 MG tablet Take 1 tablet by mouth every 6 (six) hours as needed.    . lisinopril (PRINIVIL,ZESTRIL) 10 MG tablet Take 5 mg by mouth daily.     . Omega-3 Fatty Acids (FISH OIL) 1200 MG CAPS Take 1 capsule by mouth daily.      No current facility-administered medications for this visit.    Allergies:   Latex    Social History:  The patient  reports that he has never smoked. He has never used smokeless tobacco. He reports current alcohol use of about 1.0 standard drink of alcohol per week. He reports that he does not use drugs.   Family History:  The patient's family history includes Alzheimer's disease in his mother; Cancer in his mother; Diabetes in his maternal grandfather and sister; Epilepsy in his sister; Heart attack in his father.      ROS:  General:no colds or fevers, no weight changes Skin:no rashes or ulcers HEENT:no blurred vision, no congestion CV:see HPI PUL:see HPI GI:no diarrhea constipation or melena, no indigestion GU:no hematuria, no dysuria MS:no joint pain, no claudication Neuro:no syncope, no lightheadedness Endo:no diabetes, no thyroid disease  Wt Readings from Last 3 Encounters:  12/06/19 (!) 93 kg  11/12/18 94.3 kg  08/14/17 94.2 kg     PHYSICAL EXAM:  No distress Skin warm and dry  No tachypnea No edema No JVP  Elevation   EKG:    07/10/15  SR possible Lt atrial enlargement.  No changes. 08/02/16  SR rate 84 normal ECG  08/14/17 SR rate 80 normal ECG    Recent Labs:07/07/15 Na140, K+ 4.3 LFTs were normal Cr 1.29  Bun 16 CPK 109  troponin<0.01  Lipid Panel 04/29/15  tg 127, tCHOL 147 hdl 45  LDL 76  Other studies Reviewed: Additional studies/ records that were reviewed today include: previous office notes. .Calcium Score 2017 myovue 2017 and ETT 2018 ETT 08/20/19    ASSESSMENT AND PLAN:  1. CAD: calcium score 1070 06/2015 with normal myovue March 2017 and normal ETT March 2018 and normal ETT 08/20/19 continue medical Rx  2. HTN:  Well controlled.  Continue current medications and low sodium Dash type diet.   3. Ortho:  Good result from TKR;s   Time spent reviewing calcium score and stress tests, direct patient interview and composing note 20 minutes   F/U in a year  [employer]

## 2020-09-01 ENCOUNTER — Ambulatory Visit (INDEPENDENT_AMBULATORY_CARE_PROVIDER_SITE_OTHER): Payer: BC Managed Care – PPO

## 2020-09-01 ENCOUNTER — Other Ambulatory Visit: Payer: Self-pay

## 2020-09-01 DIAGNOSIS — I2510 Atherosclerotic heart disease of native coronary artery without angina pectoris: Secondary | ICD-10-CM | POA: Diagnosis not present

## 2020-09-01 LAB — EXERCISE TOLERANCE TEST
Estimated workload: 8.5 METS
Exercise duration (min): 7 min
Exercise duration (sec): 0 s
MPHR: 157 {beats}/min
Peak HR: 155 {beats}/min
Percent HR: 98 %
RPE: 16
Rest HR: 73 {beats}/min

## 2020-09-01 LAB — SARS CORONAVIRUS 2 (TAT 6-24 HRS): SARS Coronavirus 2: NEGATIVE

## 2020-09-02 DIAGNOSIS — H31091 Other chorioretinal scars, right eye: Secondary | ICD-10-CM | POA: Diagnosis not present

## 2020-09-02 DIAGNOSIS — H33011 Retinal detachment with single break, right eye: Secondary | ICD-10-CM | POA: Diagnosis not present

## 2020-09-02 DIAGNOSIS — H33312 Horseshoe tear of retina without detachment, left eye: Secondary | ICD-10-CM | POA: Diagnosis not present

## 2020-09-02 DIAGNOSIS — H43813 Vitreous degeneration, bilateral: Secondary | ICD-10-CM | POA: Diagnosis not present

## 2020-09-04 ENCOUNTER — Other Ambulatory Visit: Payer: Self-pay

## 2020-09-04 ENCOUNTER — Encounter: Payer: Self-pay | Admitting: Cardiovascular Disease

## 2020-09-04 ENCOUNTER — Telehealth (INDEPENDENT_AMBULATORY_CARE_PROVIDER_SITE_OTHER): Payer: BC Managed Care – PPO | Admitting: Cardiovascular Disease

## 2020-09-04 ENCOUNTER — Telehealth: Payer: BC Managed Care – PPO | Admitting: Cardiovascular Disease

## 2020-09-04 VITALS — BP 115/70 | HR 88 | Ht 70.0 in | Wt 190.0 lb

## 2020-09-04 DIAGNOSIS — E782 Mixed hyperlipidemia: Secondary | ICD-10-CM | POA: Diagnosis not present

## 2020-09-04 DIAGNOSIS — I2510 Atherosclerotic heart disease of native coronary artery without angina pectoris: Secondary | ICD-10-CM | POA: Diagnosis not present

## 2020-09-04 DIAGNOSIS — I10 Essential (primary) hypertension: Secondary | ICD-10-CM | POA: Diagnosis not present

## 2020-09-04 NOTE — Patient Instructions (Signed)
Medication Instructions:  Your physician recommends that you continue on your current medications as directed. Please refer to the Current Medication list given to you today.  *If you need a refill on your cardiac medications before your next appointment, please call your pharmacy*   Lab Work: None ordered   If you have labs (blood work) drawn today and your tests are completely normal, you will receive your results only by: Marland Kitchen MyChart Message (if you have MyChart) OR . A paper copy in the mail If you have any lab test that is abnormal or we need to change your treatment, we will call you to review the results.   Testing/Procedures: None ordered    Follow-Up: At [facility], you and your health needs are our priority.  As part of our continuing mission to provide you with exceptional heart care, we have created designated Provider Care Teams.  These Care Teams include your primary Cardiologist (physician) and Advanced Practice Providers (APPs -  Physician Assistants and Nurse Practitioners) who all work together to provide you with the care you need, when you need it.  We recommend signing up for the patient portal called "MyChart".  Sign up information is provided on this After Visit Summary.  MyChart is used to connect with patients for Virtual Visits (Telemedicine).  Patients are able to view lab/test results, encounter notes, upcoming appointments, etc.  Non-urgent messages can be sent to your provider as well.   To learn more about what you can do with MyChart, go to [email or web address].    Your next appointment:   12 month(s)  The format for your next appointment:   In Person  Provider:   You may see Dr. Charlton Haws  or one of the following Advanced Practice Providers on your designated Care Team:    Georgie Chard, NP    Other Instructions None

## 2020-09-04 NOTE — Progress Notes (Signed)
Patient ID: Ricardo Mccormick, male   DOB: 16-Mar-1956, 65 y.o.   MRN: [id number]    Virtual Visit via Video Note   This visit type was conducted due to national recommendations for restrictions regarding the COVID-19 Pandemic (e.g. social distancing) in an effort to limit this patient's exposure and mitigate transmission in our community.  Due to her co-morbid illnesses, this patient is at least at moderate risk for complications without adequate follow up.  This format is felt to be most appropriate for this patient at this time.  All issues noted in this document were discussed and addressed.  A limited physical exam was performed with this format.  Please refer to the patient's chart for her consent to telehealth for [facility].   Patient Location Home Physician Location Office   Date:  09/04/2020   ID:  Ricardo Mccormick, DOB 64-Aug-1957, MRN [id number]  PCP:  Lujean Amel, MD  Cardiologist:  Johnsie Cancel    History of Present Illness:  65 y.o. history of HTN, DM, HLD and family history premature CAD. Father had MI age 48.     07/10/15 Calcium score 1070  With calcium in all 3 major arteries   Myovue done 07/22/15 normal with no ischemia EF 60%   08/09/16 ETT normal  08/20/19   ETT normal  09/01/20 ETT normal  He is taking aspirin and statin   No chest pain.  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 Arthritis of both knees 09/26/2013   Patellofemoral chondromalacia and medial joint line arthritis of the right knee. Posttraumatic arthritis of the left knee   . Diabetes mellitus without complication ([place])   . HLD (hyperlipidemia)   . HTN (hypertension)     Past Surgical History:  Procedure  Laterality Date  . COLONOSCOPY    . KNEE SURGERY Left    1980 &1992     Current Outpatient Medications  Medication Sig Dispense Refill  . Accu-Chek FastClix Lancets MISC Apply 1 each topically as needed.    Marland Kitchen ACCU-CHEK GUIDE test strip 1 each by Other route as needed.    Marland Kitchen atorvastatin (LIPITOR) 40 MG tablet Take 40 mg by mouth daily.    . Blood Glucose Monitoring Suppl (ACCU-CHEK GUIDE) w/Device KIT 1 each by Other route as needed.    Marland Kitchen ibuprofen (ADVIL) 800 MG tablet Take 1 tablet by mouth every 6 (six) hours as needed.    Marland Kitchen lisinopril (PRINIVIL,ZESTRIL) 10 MG tablet Take 5 mg by mouth daily.      No current facility-administered medications for this visit.    Allergies:   Latex    Social History:  The patient  reports that he has never smoked. He has never used smokeless tobacco. He reports current alcohol use of about 1.0 standard drink of alcohol per week. He reports that he does not use drugs.   Family History:  The patient's family history includes Alzheimer's disease in his mother; Cancer in his mother; Diabetes in his maternal grandfather and sister; Epilepsy in his sister; Heart attack in his father.    ROS:  General:no colds or fevers, no weight changes Skin:no rashes or ulcers  HEENT:no blurred vision, no congestion CV:see HPI PUL:see HPI GI:no diarrhea constipation or melena, no indigestion GU:no hematuria, no dysuria MS:no joint pain, no claudication Neuro:no syncope, no lightheadedness Endo:no diabetes, no thyroid disease  Wt Readings from Last 3 Encounters:  09/04/20 86.2 kg  12/06/19 (!) 93 kg  11/12/18 94.3 kg     PHYSICAL EXAM:  No distress Skin warm and dry  No tachypnea No edema No JVP  Elevation   EKG:    07/10/15  SR possible Lt atrial enlargement.  No changes. 08/02/16  SR rate 84 normal ECG  08/14/17 SR rate 80 normal ECG    Recent Labs:07/07/15 Na140, K+ 4.3 LFTs were normal Cr 1.29  Bun 16 CPK 109 troponin<0.01  Lipid Panel 04/29/15   tg 127, tCHOL 147 hdl 45  LDL 76  Other studies Reviewed: Additional studies/ records that were reviewed today include: previous office notes. .Calcium Score 2017 myovue 2017 and ETT 2018 ETT 08/20/19    ASSESSMENT AND PLAN:  1. CAD: calcium score 1070 06/2015 with normal myovue March 2017 and normal ETT multiple times most recently  April of this year continue statin and 81 mg ASA  2. HTN:  Well controlled.  Continue current medications and low sodium Dash type diet.   3. Ortho:  Good result from TKR;s   Time spent reviewing calcium score and stress tests, direct patient interview and composing note 20 minutes   F/U in a year  Time spent reviewing chart stress tests calcium score Direct patient interview and composing note 20 minutes   Jenkins Rouge

## 2020-09-21 DIAGNOSIS — M9903 Segmental and somatic dysfunction of lumbar region: Secondary | ICD-10-CM | POA: Diagnosis not present

## 2020-10-07 DIAGNOSIS — M791 Myalgia, unspecified site: Secondary | ICD-10-CM | POA: Diagnosis not present

## 2020-10-07 DIAGNOSIS — M9903 Segmental and somatic dysfunction of lumbar region: Secondary | ICD-10-CM | POA: Diagnosis not present

## 2020-12-04 DIAGNOSIS — H43813 Vitreous degeneration, bilateral: Secondary | ICD-10-CM | POA: Diagnosis not present

## 2020-12-04 DIAGNOSIS — H31093 Other chorioretinal scars, bilateral: Secondary | ICD-10-CM | POA: Diagnosis not present

## 2020-12-04 DIAGNOSIS — H3562 Retinal hemorrhage, left eye: Secondary | ICD-10-CM | POA: Diagnosis not present

## 2020-12-04 DIAGNOSIS — H43393 Other vitreous opacities, bilateral: Secondary | ICD-10-CM | POA: Diagnosis not present

## 2020-12-22 DIAGNOSIS — E119 Type 2 diabetes mellitus without complications: Secondary | ICD-10-CM | POA: Diagnosis not present

## 2020-12-22 DIAGNOSIS — I10 Essential (primary) hypertension: Secondary | ICD-10-CM | POA: Diagnosis not present

## 2020-12-24 DIAGNOSIS — H43393 Other vitreous opacities, bilateral: Secondary | ICD-10-CM | POA: Diagnosis not present

## 2020-12-24 DIAGNOSIS — H31093 Other chorioretinal scars, bilateral: Secondary | ICD-10-CM | POA: Diagnosis not present

## 2020-12-24 DIAGNOSIS — H3562 Retinal hemorrhage, left eye: Secondary | ICD-10-CM | POA: Diagnosis not present

## 2020-12-24 DIAGNOSIS — H43813 Vitreous degeneration, bilateral: Secondary | ICD-10-CM | POA: Diagnosis not present

## 2020-12-28 DIAGNOSIS — M791 Myalgia, unspecified site: Secondary | ICD-10-CM | POA: Diagnosis not present

## 2020-12-28 DIAGNOSIS — M9903 Segmental and somatic dysfunction of lumbar region: Secondary | ICD-10-CM | POA: Diagnosis not present

## 2021-01-14 DIAGNOSIS — M9903 Segmental and somatic dysfunction of lumbar region: Secondary | ICD-10-CM | POA: Diagnosis not present

## 2021-01-14 DIAGNOSIS — M791 Myalgia, unspecified site: Secondary | ICD-10-CM | POA: Diagnosis not present

## 2021-02-10 DIAGNOSIS — M791 Myalgia, unspecified site: Secondary | ICD-10-CM | POA: Diagnosis not present

## 2021-02-10 DIAGNOSIS — M9903 Segmental and somatic dysfunction of lumbar region: Secondary | ICD-10-CM | POA: Diagnosis not present

## 2021-03-03 DIAGNOSIS — M791 Myalgia, unspecified site: Secondary | ICD-10-CM | POA: Diagnosis not present

## 2021-03-03 DIAGNOSIS — M9901 Segmental and somatic dysfunction of cervical region: Secondary | ICD-10-CM | POA: Diagnosis not present

## 2021-03-03 DIAGNOSIS — M9903 Segmental and somatic dysfunction of lumbar region: Secondary | ICD-10-CM | POA: Diagnosis not present

## 2021-03-24 DIAGNOSIS — M7912 Myalgia of auxiliary muscles, head and neck: Secondary | ICD-10-CM | POA: Diagnosis not present

## 2021-03-24 DIAGNOSIS — M9903 Segmental and somatic dysfunction of lumbar region: Secondary | ICD-10-CM | POA: Diagnosis not present

## 2021-03-24 DIAGNOSIS — M791 Myalgia, unspecified site: Secondary | ICD-10-CM | POA: Diagnosis not present

## 2021-03-24 DIAGNOSIS — M9902 Segmental and somatic dysfunction of thoracic region: Secondary | ICD-10-CM | POA: Diagnosis not present

## 2021-03-31 DIAGNOSIS — M9902 Segmental and somatic dysfunction of thoracic region: Secondary | ICD-10-CM | POA: Diagnosis not present

## 2021-03-31 DIAGNOSIS — M7912 Myalgia of auxiliary muscles, head and neck: Secondary | ICD-10-CM | POA: Diagnosis not present

## 2021-03-31 DIAGNOSIS — M9903 Segmental and somatic dysfunction of lumbar region: Secondary | ICD-10-CM | POA: Diagnosis not present

## 2021-05-26 DIAGNOSIS — D225 Melanocytic nevi of trunk: Secondary | ICD-10-CM | POA: Diagnosis not present

## 2021-05-26 DIAGNOSIS — L578 Other skin changes due to chronic exposure to nonionizing radiation: Secondary | ICD-10-CM | POA: Diagnosis not present

## 2021-05-26 DIAGNOSIS — L821 Other seborrheic keratosis: Secondary | ICD-10-CM | POA: Diagnosis not present

## 2021-05-26 DIAGNOSIS — L570 Actinic keratosis: Secondary | ICD-10-CM | POA: Diagnosis not present

## 2021-06-09 DIAGNOSIS — M9902 Segmental and somatic dysfunction of thoracic region: Secondary | ICD-10-CM | POA: Diagnosis not present

## 2021-06-09 DIAGNOSIS — M7912 Myalgia of auxiliary muscles, head and neck: Secondary | ICD-10-CM | POA: Diagnosis not present

## 2021-06-09 DIAGNOSIS — M9903 Segmental and somatic dysfunction of lumbar region: Secondary | ICD-10-CM | POA: Diagnosis not present

## 2021-06-28 DIAGNOSIS — Z125 Encounter for screening for malignant neoplasm of prostate: Secondary | ICD-10-CM | POA: Diagnosis not present

## 2021-06-28 DIAGNOSIS — E7800 Pure hypercholesterolemia, unspecified: Secondary | ICD-10-CM | POA: Diagnosis not present

## 2021-06-28 DIAGNOSIS — E119 Type 2 diabetes mellitus without complications: Secondary | ICD-10-CM | POA: Diagnosis not present

## 2021-06-28 DIAGNOSIS — I10 Essential (primary) hypertension: Secondary | ICD-10-CM | POA: Diagnosis not present

## 2021-06-28 DIAGNOSIS — E0590 Thyrotoxicosis, unspecified without thyrotoxic crisis or storm: Secondary | ICD-10-CM | POA: Diagnosis not present

## 2021-06-28 DIAGNOSIS — Z79899 Other long term (current) drug therapy: Secondary | ICD-10-CM | POA: Diagnosis not present

## 2021-06-28 DIAGNOSIS — Z0000 Encounter for general adult medical examination without abnormal findings: Secondary | ICD-10-CM | POA: Diagnosis not present

## 2021-07-02 DIAGNOSIS — H35373 Puckering of macula, bilateral: Secondary | ICD-10-CM | POA: Diagnosis not present

## 2021-07-02 DIAGNOSIS — H31093 Other chorioretinal scars, bilateral: Secondary | ICD-10-CM | POA: Diagnosis not present

## 2021-07-02 DIAGNOSIS — H43393 Other vitreous opacities, bilateral: Secondary | ICD-10-CM | POA: Diagnosis not present

## 2021-07-02 DIAGNOSIS — H43813 Vitreous degeneration, bilateral: Secondary | ICD-10-CM | POA: Diagnosis not present

## 2021-07-14 DIAGNOSIS — M9903 Segmental and somatic dysfunction of lumbar region: Secondary | ICD-10-CM | POA: Diagnosis not present

## 2021-07-14 DIAGNOSIS — M9902 Segmental and somatic dysfunction of thoracic region: Secondary | ICD-10-CM | POA: Diagnosis not present

## 2021-07-14 DIAGNOSIS — M7912 Myalgia of auxiliary muscles, head and neck: Secondary | ICD-10-CM | POA: Diagnosis not present

## 2021-08-16 DIAGNOSIS — M7912 Myalgia of auxiliary muscles, head and neck: Secondary | ICD-10-CM | POA: Diagnosis not present

## 2021-08-16 DIAGNOSIS — M9902 Segmental and somatic dysfunction of thoracic region: Secondary | ICD-10-CM | POA: Diagnosis not present

## 2021-08-16 DIAGNOSIS — M9903 Segmental and somatic dysfunction of lumbar region: Secondary | ICD-10-CM | POA: Diagnosis not present

## 2021-08-26 DIAGNOSIS — M9903 Segmental and somatic dysfunction of lumbar region: Secondary | ICD-10-CM | POA: Diagnosis not present

## 2021-08-26 DIAGNOSIS — M7912 Myalgia of auxiliary muscles, head and neck: Secondary | ICD-10-CM | POA: Diagnosis not present

## 2021-08-26 DIAGNOSIS — M9902 Segmental and somatic dysfunction of thoracic region: Secondary | ICD-10-CM | POA: Diagnosis not present

## 2021-08-31 DIAGNOSIS — E0590 Thyrotoxicosis, unspecified without thyrotoxic crisis or storm: Secondary | ICD-10-CM | POA: Diagnosis not present

## 2021-10-29 DIAGNOSIS — E0590 Thyrotoxicosis, unspecified without thyrotoxic crisis or storm: Secondary | ICD-10-CM | POA: Diagnosis not present

## 2021-11-04 DIAGNOSIS — I10 Essential (primary) hypertension: Secondary | ICD-10-CM | POA: Diagnosis not present

## 2021-11-04 DIAGNOSIS — E0590 Thyrotoxicosis, unspecified without thyrotoxic crisis or storm: Secondary | ICD-10-CM | POA: Diagnosis not present

## 2021-11-04 DIAGNOSIS — E119 Type 2 diabetes mellitus without complications: Secondary | ICD-10-CM | POA: Diagnosis not present

## 2021-11-04 DIAGNOSIS — E7800 Pure hypercholesterolemia, unspecified: Secondary | ICD-10-CM | POA: Diagnosis not present

## 2021-12-24 DIAGNOSIS — Z23 Encounter for immunization: Secondary | ICD-10-CM | POA: Diagnosis not present

## 2021-12-24 DIAGNOSIS — E119 Type 2 diabetes mellitus without complications: Secondary | ICD-10-CM | POA: Diagnosis not present

## 2021-12-24 DIAGNOSIS — I10 Essential (primary) hypertension: Secondary | ICD-10-CM | POA: Diagnosis not present

## 2022-03-01 DIAGNOSIS — E0590 Thyrotoxicosis, unspecified without thyrotoxic crisis or storm: Secondary | ICD-10-CM | POA: Diagnosis not present

## 2022-03-01 DIAGNOSIS — E119 Type 2 diabetes mellitus without complications: Secondary | ICD-10-CM | POA: Diagnosis not present

## 2022-03-01 DIAGNOSIS — I10 Essential (primary) hypertension: Secondary | ICD-10-CM | POA: Diagnosis not present

## 2022-03-04 NOTE — Progress Notes (Unsigned)
Patient ID: Ricardo Mccormick, male   DOB: 03/28/1956, 66 y.o.   MRN: [id number]     Date:  03/09/2022   ID:  Ricardo Mccormick, DOB 08/14/1955, MRN [id number]  PCP:  Koirala, Dibas, MD  Cardiologist:  Nishan    History of Present Illness:  66 y.o. history of HTN, DM, HLD and family history premature CAD. Father had MI age 53.     07/10/15 Calcium score 1070  With calcium in all 3 major arteries   Myovue done 07/22/15 normal with no ischemia EF 60%   08/09/16 ETT normal  08/20/19   ETT normal  09/01/20 ETT normal  He is taking aspirin and statin   No chest pain.  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 Posttraumatic arthritis of the left knee    Diabetes mellitus without complication (HCC)    HLD (hyperlipidemia)    HTN (hypertension)     Past Surgical History:  Procedure Laterality Date   COLONOSCOPY     KNEE SURGERY Left    1980 &1992     Current Outpatient Medications  Medication Sig Dispense Refill   Accu-Chek FastClix Lancets MISC Apply 1 each topically as needed.     ACCU-CHEK GUIDE test strip 1 each by Other route as needed.     aspirin EC 81 MG tablet Take 81 mg by mouth every other day. Swallow whole.     atorvastatin (LIPITOR) 40 MG tablet Take 40 mg by mouth daily.     Blood Glucose Monitoring Suppl (ACCU-CHEK GUIDE) w/Device KIT 1 each by Other route as needed.     lisinopril (PRINIVIL,ZESTRIL) 10 MG tablet Take 5 mg by mouth daily.      No current facility-administered  medications for this visit.    Allergies:   Latex    Social History:  The patient  reports that he has never smoked. He has never used smokeless tobacco. He reports current alcohol use of about 1.0 standard drink of alcohol per week. He reports that he does not use drugs.   Family History:  The patient's family history includes Alzheimer's disease in his mother; Cancer in his mother; Diabetes in his maternal grandfather and sister; Epilepsy in his sister; Heart attack in his father.    ROS:  General:no colds or fevers, no weight changes Skin:no rashes or ulcers HEENT:no blurred vision, no congestion CV:see HPI PUL:see HPI GI:no diarrhea constipation or melena, no indigestion GU:no hematuria, no dysuria MS:no joint pain, no claudication Neuro:no syncope, no lightheadedness Endo:no diabetes, no thyroid disease  Wt Readings from Last 3 Encounters:  03/09/22 214 lb (97.1 kg)  09/04/20 190 lb (86.2 kg)  12/06/19 (!) 205 lb (93 kg)     PHYSICAL EXAM:   Affect appropriate Healthy:  appears stated age HEENT: normal Neck supple with no adenopathy JVP normal no bruits no thyromegaly Lungs clear with no wheezing and good diaphragmatic motion Heart:  S1/S2 no murmur, no rub, gallop or click   PMI normal Abdomen: benighn, BS positve, no tenderness, no AAA no bruit.  No HSM or HJR Distal pulses intact with no bruits No edema Neuro non-focal Skin warm and dry No muscular weakness   EKG:    03/09/2022 SR rate 69 PR 210 msec normal    Recent Labs:07/07/15 Na140, K+ 4.3 LFTs were normal Cr 1.29  Bun 16 CPK 109 troponin<0.01  Lipid Panel 04/29/15  tg 127, tCHOL 147 hdl 45  LDL 76  Other studies Reviewed: Additional studies/ records that were reviewed today include: previous office notes. .Calcium Score 2017 myovue 2017 and ETT 2018 ETT 08/20/19    ASSESSMENT AND PLAN:  1. CAD: calcium score 1070 06/2015 with normal myovue March 2017 and normal ETT multiple times most recently   April 2022 continue statin and 81 mg ASA  2. HTN:  Well controlled.  Continue current medications and low sodium Dash type diet.   3. Ortho:  Good result from TKR;s  4. First Degree:  minor change PR 210 msec yearly ECG    F/U in a year   Peter Nishan   

## 2022-03-09 ENCOUNTER — Ambulatory Visit: Payer: BC Managed Care – PPO | Attending: Cardiovascular Disease | Admitting: Cardiovascular Disease

## 2022-03-09 ENCOUNTER — Encounter: Payer: Self-pay | Admitting: Cardiovascular Disease

## 2022-03-09 VITALS — BP 126/72 | HR 69 | Ht 70.0 in | Wt 214.0 lb

## 2022-03-09 DIAGNOSIS — I10 Essential (primary) hypertension: Secondary | ICD-10-CM | POA: Diagnosis not present

## 2022-03-09 DIAGNOSIS — E782 Mixed hyperlipidemia: Secondary | ICD-10-CM

## 2022-03-09 DIAGNOSIS — I2510 Atherosclerotic heart disease of native coronary artery without angina pectoris: Secondary | ICD-10-CM | POA: Diagnosis not present

## 2022-03-09 NOTE — Patient Instructions (Signed)
Medication Instructions:  Your physician recommends that you continue on your current medications as directed. Please refer to the Current Medication list given to you today.  *If you need a refill on your cardiac medications before your next appointment, please call your pharmacy*  Lab Work: If you have labs (blood work) drawn today and your tests are completely normal, you will receive your results only by: MyChart Message (if you have MyChart) OR A paper copy in the mail If you have any lab test that is abnormal or we need to change your treatment, we will call you to review the results.  Testing/Procedures: None ordered today.  Follow-Up: At [facility], you and your health needs are our priority.  As part of our continuing mission to provide you with exceptional heart care, we have created designated Provider Care Teams.  These Care Teams include your primary Cardiologist (physician) and Advanced Practice Providers (APPs -  Physician Assistants and Nurse Practitioners) who all work together to provide you with the care you need, when you need it.  We recommend signing up for the patient portal called "MyChart".  Sign up information is provided on this After Visit Summary.  MyChart is used to connect with patients for Virtual Visits (Telemedicine).  Patients are able to view lab/test results, encounter notes, upcoming appointments, etc.  Non-urgent messages can be sent to your provider as well.   To learn more about what you can do with MyChart, go to [email or web address].    Your next appointment:   1 year(s)  The format for your next appointment:   In Person  Provider:   Peter Nishan, MD     Important Information About Sugar       

## 2022-03-21 DIAGNOSIS — M5417 Radiculopathy, lumbosacral region: Secondary | ICD-10-CM | POA: Diagnosis not present

## 2022-03-21 DIAGNOSIS — M9903 Segmental and somatic dysfunction of lumbar region: Secondary | ICD-10-CM | POA: Diagnosis not present

## 2022-03-21 DIAGNOSIS — M791 Myalgia, unspecified site: Secondary | ICD-10-CM | POA: Diagnosis not present

## 2022-03-23 DIAGNOSIS — M791 Myalgia, unspecified site: Secondary | ICD-10-CM | POA: Diagnosis not present

## 2022-03-23 DIAGNOSIS — M9903 Segmental and somatic dysfunction of lumbar region: Secondary | ICD-10-CM | POA: Diagnosis not present

## 2022-03-28 DIAGNOSIS — M5432 Sciatica, left side: Secondary | ICD-10-CM | POA: Diagnosis not present

## 2022-03-29 DIAGNOSIS — M47896 Other spondylosis, lumbar region: Secondary | ICD-10-CM | POA: Diagnosis not present

## 2022-03-29 DIAGNOSIS — M5416 Radiculopathy, lumbar region: Secondary | ICD-10-CM | POA: Diagnosis not present

## 2022-03-30 ENCOUNTER — Encounter: Payer: Self-pay | Admitting: Rehabilitative and Restorative Service Providers"

## 2022-03-30 ENCOUNTER — Other Ambulatory Visit: Payer: Self-pay

## 2022-03-30 ENCOUNTER — Ambulatory Visit: Payer: BC Managed Care – PPO | Attending: Anesthesiology | Admitting: Rehabilitative and Restorative Service Providers"

## 2022-03-30 DIAGNOSIS — R29898 Other symptoms and signs involving the musculoskeletal system: Secondary | ICD-10-CM | POA: Insufficient documentation

## 2022-03-30 DIAGNOSIS — I10 Essential (primary) hypertension: Secondary | ICD-10-CM | POA: Insufficient documentation

## 2022-03-30 DIAGNOSIS — M5416 Radiculopathy, lumbar region: Secondary | ICD-10-CM | POA: Insufficient documentation

## 2022-03-30 DIAGNOSIS — R531 Weakness: Secondary | ICD-10-CM | POA: Insufficient documentation

## 2022-03-30 DIAGNOSIS — M5459 Other low back pain: Secondary | ICD-10-CM

## 2022-03-30 DIAGNOSIS — Z7409 Other reduced mobility: Secondary | ICD-10-CM | POA: Insufficient documentation

## 2022-03-30 DIAGNOSIS — M545 Low back pain, unspecified: Secondary | ICD-10-CM | POA: Diagnosis not present

## 2022-03-30 NOTE — Therapy (Signed)
OUTPATIENT PHYSICAL THERAPY THORACOLUMBAR EVALUATION   Patient Name: Ricardo Mccormick MRN: [id number] DOB:[birth year], 66 y.o., male Today's Date: 03/30/2022   PT End of Session - 03/30/22 1027     Visit Number 1    Number of Visits 16    Date for PT Re-Evaluation 05/29/22    Authorization Type [employer] 30 visits/ year (25 remaining)    PT Start Time 1017    PT Stop Time 1110    PT Time Calculation (min) 53 min    Activity Tolerance Patient tolerated treatment well    Behavior During Therapy WFL for tasks assessed/performed             Past Medical History:  Diagnosis Date   Arthritis of both knees 09/26/2013   Patellofemoral chondromalacia and medial joint line arthritis of the right knee. Posttraumatic arthritis of the left knee    Diabetes mellitus without complication (HCC)    HLD (hyperlipidemia)    HTN (hypertension)    Past Surgical History:  Procedure Laterality Date   COLONOSCOPY     KNEE SURGERY Left    1980 &1992   Patient Active Problem List   Diagnosis Date Noted   Arthritis of both knees 09/26/2013    PCP: Valetta Close, MD REFERRING PROVIDER: Ceasar Lund, MD REFERRING DIAG: Lumbar Radiculopathy Rationale for Evaluation and Treatment: Rehabilitation  THERAPY DIAG:  Other low back pain  Other symptoms and signs involving the musculoskeletal system  ONSET DATE: 03/19/22  SUBJECTIVE:                                                                                                                                                                                           SUBJECTIVE STATEMENT: The patient started with acute low back pain and into L gluts> front of thigh>and sometimes down to ankle. This began 03/19/22. He saw his chiropractor with no relief. He had xrays with some bone spurring and possibly foraminal stenosis (he reports narrowing where the nerve comes through).   PERTINENT HISTORY:  L TKR-- no lateral sensation at knee and lateral lower  leg, HTN, prediabetic, HLD  PAIN:  Are you having pain? Yes: NPRS scale: 7/10 Pain location: L glut med region Pain description: achiness, squeezing sensation in thigh Aggravating factors: sitting Relieving factors: standing, walking, laying on right side  PRECAUTIONS: None  WEIGHT BEARING RESTRICTIONS: No  FALLS:  Has patient fallen in last 6 months? No  LIVING ENVIRONMENT: Lives with: lives with their spouse Lives in: House/apartment  OCCUPATION: desk/ Nature conservation officer  PLOF: Independent  PATIENT GOALS: reduce pain  NEXT MD VISIT:   OBJECTIVE:   DIAGNOSTIC FINDINGS:  Xrays completed at [facility] (see subjective)  PATIENT SURVEYS:  N/a--not in FOTO  SCREENING FOR RED FLAGS: Bowel or bladder incontinence:  no incontinence; does not recent onset of feeling that there is still reserve of urine at end of urination *Denies saddle paresthesias  SENSATION: WFL  MUSCLE LENGTH: Hamstrings: Right -40 deg; Left -45 deg Thomas test: Pain in L glut with thomas test L  POSTURE: No Significant postural limitations  PALPATION: Quadratus lumborum L tender to palpation, L glut med and L piriformis painful to palpation  LUMBAR ROM:   AROM eval  Flexion 50% limited Most painful  Extension 50% limitation  Right lateral flexion 50% limitation  Left lateral flexion 50% limitation  Right rotation 50% limitation Pain referred into groin  Left rotation 50% limitation   (Blank rows = not tested)  LOWER EXTREMITY MMT:    MMT Right eval Left eval  Hip flexion 5/5 5/5  Hip extension    Hip abduction    Hip adduction    Hip internal rotation    Hip external rotation    Knee flexion 5/5 5/5  Knee extension 5/5 5/5  Ankle dorsiflexion 5/5 5/5  Ankle plantarflexion 5/5 4/5  Ankle inversion    Ankle eversion     (Blank rows = not tested)  LUMBAR SPECIAL TESTS:  Straight leg raise test: Produces pain in L thigh with R SLR Thomas test: produces pain in L posterior  hip  [facility] Adult PT Treatment:                                                DATE: 03/30/22 Therapeutic Exercise: See HEP below Manual Therapy: STM L glut and piriformis Trigger Point Dry-Needling  Treatment instructions: Expect mild to moderate muscle soreness. Patient Consent Given: Yes Education handout provided: Yes Muscles treated: L glut med, L pirifomris Treatment response/outcome: palpable lengthening and reduced pain Modalities: Heat and e-stim interferential to tolerance L glut  PATIENT EDUCATION:  Education details: dry needling handout, HEP Person educated: Patient Education method: Explanation, Demonstration, and Handouts Education comprehension: verbalized understanding, returned demonstration, and needs further education  HOME EXERCISE PROGRAM: Access Code: [email or web address] URL: [email or web address] Date: 03/30/2022 Prepared by: Margretta Ditty  Exercises - Lying Prone  - 2 x daily - 7 x weekly - 1 sets - 1 reps - 5 minutes hold - Supine Piriformis Stretch with Foot on Ground  - 2 x daily - 7 x weekly - 1 sets - 3 reps - 20-30 seconds hold - Supine Single Knee to Chest Stretch  - 2 x daily - 7 x weekly - 1 sets - 3 reps - 20-30 seconds hold - sidelying low back stretch  - 2 x daily - 7 x weekly - 1 sets - 3 reps - 20-30 seconds hold  ASSESSMENT:  CLINICAL IMPRESSION: Patient is a 66 y.o. male who was seen today for physical therapy evaluation and treatment for acute onset of lumbar pain with radiating symptoms into the L glut and thigh. He has had one prior episode of this pain approximately 5 years ago. The patient had some relief with STM and ther ex during session today. PT to address deficits to reduce pain.  OBJECTIVE IMPAIRMENTS: decreased mobility, decreased ROM, decreased strength, hypomobility, increased fascial restrictions, impaired flexibility, impaired sensation, and pain.   ACTIVITY LIMITATIONS: sitting and locomotion level  PARTICIPATION  LIMITATIONS: driving and occupation  PERSONAL FACTORS: 1 comorbidity: HTN  are also affecting patient's functional outcome.   REHAB POTENTIAL: Good  CLINICAL DECISION MAKING: Stable/uncomplicated  EVALUATION COMPLEXITY: Low   GOALS: Goals reviewed with patient? Yes  SHORT TERM GOALS: Target date: 04/27/2022  Independent with initial HEP Goal status: INITIAL  2.  The patient will report pain at rest < or equal to 3/10. Baseline:7/10 Goal status: INITIAL   LONG TERM GOALS: Target date: 05/25/2022  Independent with final HEP Goal status: INITIAL  2.  The patient will be able to return to full day of work. Baseline: leaves after 4 hours Goal status: INITIAL  3. The patient will improve A/ROM lumbar flexion to 75% full range. Baseline: 50% with pain Goal status: INIITAL  4.  The patient will report no pain at rest. Baseline:7/10 Goal status: INITIAL  PLAN:  PT FREQUENCY: 2x/week  PT DURATION: 8 weeks  PLANNED INTERVENTIONS: Therapeutic exercises, Therapeutic activity, Neuromuscular re-education, Balance training, Gait training, Patient/Family education, Self Care, Joint mobilization, Dry Needling, Electrical stimulation, Spinal mobilization, Moist heat, Traction, and Manual therapy.  PLAN FOR NEXT SESSION: Check HEP progress, STM/DN, consider traction if needed for radicular symptoms, stretching and strengthening to tolerance.   Yuuki Skeens, PT 03/30/2022, 1:46 PM

## 2022-04-01 ENCOUNTER — Encounter: Payer: Self-pay | Admitting: Physical Therapy

## 2022-04-01 ENCOUNTER — Ambulatory Visit: Payer: BC Managed Care – PPO | Admitting: Physical Therapy

## 2022-04-01 DIAGNOSIS — M5459 Other low back pain: Secondary | ICD-10-CM

## 2022-04-01 DIAGNOSIS — Z7409 Other reduced mobility: Secondary | ICD-10-CM | POA: Diagnosis not present

## 2022-04-01 DIAGNOSIS — I10 Essential (primary) hypertension: Secondary | ICD-10-CM | POA: Diagnosis not present

## 2022-04-01 DIAGNOSIS — R29898 Other symptoms and signs involving the musculoskeletal system: Secondary | ICD-10-CM | POA: Diagnosis not present

## 2022-04-01 DIAGNOSIS — M545 Low back pain, unspecified: Secondary | ICD-10-CM | POA: Diagnosis not present

## 2022-04-01 DIAGNOSIS — M5416 Radiculopathy, lumbar region: Secondary | ICD-10-CM | POA: Diagnosis not present

## 2022-04-01 DIAGNOSIS — R531 Weakness: Secondary | ICD-10-CM | POA: Diagnosis not present

## 2022-04-01 NOTE — Therapy (Signed)
OUTPATIENT PHYSICAL THERAPY TREATMENT   Patient Name: Ricardo Mccormick MRN: [id number] DOB:1956/03/12, 66 y.o., male Today's Date: 04/01/2022   PT End of Session - 04/01/22 0837     Visit Number 2    Number of Visits 16    Date for PT Re-Evaluation 05/29/22    Authorization Type [employer] 30 visits/ year (25 remaining)    PT Start Time 0845    PT Stop Time 0930    PT Time Calculation (min) 45 min    Activity Tolerance Patient tolerated treatment well    Behavior During Therapy WFL for tasks assessed/performed             Past Medical History:  Diagnosis Date   Arthritis of both knees 09/26/2013   Patellofemoral chondromalacia and medial joint line arthritis of the right knee. Posttraumatic arthritis of the left knee    Diabetes mellitus without complication (HCC)    HLD (hyperlipidemia)    HTN (hypertension)    Past Surgical History:  Procedure Laterality Date   COLONOSCOPY     KNEE SURGERY Left    1980 &1992   Patient Active Problem List   Diagnosis Date Noted   Arthritis of both knees 09/26/2013    PCP: Valetta Close, MD REFERRING PROVIDER: Ceasar Lund, MD REFERRING DIAG: Lumbar Radiculopathy Rationale for Evaluation and Treatment: Rehabilitation  THERAPY DIAG:  Other low back pain  Other symptoms and signs involving the musculoskeletal system  ONSET DATE: 03/19/22  SUBJECTIVE:                                                                                                                                                                                           SUBJECTIVE STATEMENT: Overall pt states he does feel that it's improving. Reports TPDN did seem to help but he sat at the desk and it seemed to tighten back up.   PERTINENT HISTORY:  L TKR-- no lateral sensation at knee and lateral lower leg, HTN, prediabetic, HLD  PAIN:  Are you having pain? Yes: NPRS scale: 5/10 Pain location: L glut med region Pain description: achiness, squeezing sensation in  thigh Aggravating factors: sitting Relieving factors: standing, walking, laying on right side  PRECAUTIONS: None  WEIGHT BEARING RESTRICTIONS: No  FALLS:  Has patient fallen in last 6 months? No  LIVING ENVIRONMENT: Lives with: lives with their spouse Lives in: House/apartment  OCCUPATION: desk/ Nature conservation officer  PLOF: Independent  PATIENT GOALS: reduce pain  NEXT MD VISIT:   OBJECTIVE:   DIAGNOSTIC FINDINGS:  Xrays completed at [facility] (see subjective)  PATIENT SURVEYS:  N/a--not in FOTO  SCREENING FOR RED FLAGS: Bowel  or bladder incontinence:  no incontinence; does not recent onset of feeling that there is still reserve of urine at end of urination *Denies saddle paresthesias  SENSATION: WFL  MUSCLE LENGTH: Hamstrings: Right -40 deg; Left -45 deg Thomas test: Pain in L glut with thomas test L  POSTURE: No Significant postural limitations  PALPATION: Quadratus lumborum L tender to palpation, L glut med and L piriformis painful to palpation  LUMBAR ROM:   AROM eval  Flexion 50% limited Most painful  Extension 50% limitation  Right lateral flexion 50% limitation  Left lateral flexion 50% limitation  Right rotation 50% limitation Pain referred into groin  Left rotation 50% limitation   (Blank rows = not tested)  LOWER EXTREMITY MMT:    MMT Right eval Left eval  Hip flexion 5/5 5/5  Hip extension    Hip abduction    Hip adduction    Hip internal rotation    Hip external rotation    Knee flexion 5/5 5/5  Knee extension 5/5 5/5  Ankle dorsiflexion 5/5 5/5  Ankle plantarflexion 5/5 4/5  Ankle inversion    Ankle eversion     (Blank rows = not tested)  LUMBAR SPECIAL TESTS:  Straight leg raise test: Produces pain in L thigh with R SLR Thomas test: produces pain in L posterior hip  [facility] Adult PT Treatment:                                                DATE: 04/01/22 Therapeutic Exercise: Treadmill warm up 2.0 mph x 5  min Sitting Hamstring stretch 2x30 sec R&L Hip flexor stretch x30 sec Piriformis stretch x30 sec Standing Hip flexor stretch on stairs x30 sec "L" stretch x30 sec with lateral flexion to the right x30 sec Sidelying Side plank 2x30 sec R&L forearms and knees Prone Plank 2x30 sec forearms and knees Manual Therapy: STM L glut and piriformis and quadratus lumborum Trigger Point Dry-Needling  Treatment instructions: Expect mild to moderate muscle soreness. Patient Consent Given: Yes Education handout provided: Yes Muscles treated: L glut med, L pirifomris, L lumbar paraspinals and QL Treatment response/outcome: palpable lengthening and reduced pain  Modalities: Heat x 10 min at end of session   [facility] Adult PT Treatment:                                                DATE: 03/30/22 Therapeutic Exercise: See HEP below Manual Therapy: STM L glut and piriformis Trigger Point Dry-Needling  Treatment instructions: Expect mild to moderate muscle soreness. Patient Consent Given: Yes Education handout provided: Yes Muscles treated: L glut med, L pirifomris Treatment response/outcome: palpable lengthening and reduced pain Modalities: Heat and e-stim interferential to tolerance L glut  PATIENT EDUCATION:  Education details: dry needling handout, HEP Person educated: Patient Education method: Explanation, Demonstration, and Handouts Education comprehension: verbalized understanding, returned demonstration, and needs further education  HOME EXERCISE PROGRAM: Access Code: [email or web address] URL: [email or web address] Date: 03/30/2022 Prepared by: Margretta Ditty  Exercises - Lying Prone  - 2 x daily - 7 x weekly - 1 sets - 1 reps - 5 minutes hold - Supine Piriformis Stretch with Foot on Ground  - 2 x daily - 7 x weekly - 1  sets - 3 reps - 20-30 seconds hold - Supine Single Knee to Chest Stretch  - 2 x daily - 7 x weekly - 1 sets - 3 reps - 20-30 seconds hold - sidelying low back  stretch  - 2 x daily - 7 x weekly - 1 sets - 3 reps - 20-30 seconds hold  ASSESSMENT:  CLINICAL IMPRESSION: Continued to provide TPDN and manual work to address muscular tightness. Initiated core strengthening this session. Modified stretches and HEP accordingly.  OBJECTIVE IMPAIRMENTS: decreased mobility, decreased ROM, decreased strength, hypomobility, increased fascial restrictions, impaired flexibility, impaired sensation, and pain.   ACTIVITY LIMITATIONS: sitting and locomotion level  PARTICIPATION LIMITATIONS: driving and occupation  PERSONAL FACTORS: 1 comorbidity: HTN  are also affecting patient's functional outcome.   REHAB POTENTIAL: Good  CLINICAL DECISION MAKING: Stable/uncomplicated  EVALUATION COMPLEXITY: Low   GOALS: Goals reviewed with patient? Yes  SHORT TERM GOALS: Target date: 04/29/2022  Independent with initial HEP Goal status: INITIAL  2.  The patient will report pain at rest < or equal to 3/10. Baseline:7/10 Goal status: INITIAL   LONG TERM GOALS: Target date: 05/27/2022  Independent with final HEP Goal status: INITIAL  2.  The patient will be able to return to full day of work. Baseline: leaves after 4 hours Goal status: INITIAL  3. The patient will improve A/ROM lumbar flexion to 75% full range. Baseline: 50% with pain Goal status: INIITAL  4.  The patient will report no pain at rest. Baseline:7/10 Goal status: INITIAL  PLAN:  PT FREQUENCY: 2x/week  PT DURATION: 8 weeks  PLANNED INTERVENTIONS: Therapeutic exercises, Therapeutic activity, Neuromuscular re-education, Balance training, Gait training, Patient/Family education, Self Care, Joint mobilization, Dry Needling, Electrical stimulation, Spinal mobilization, Moist heat, Traction, and Manual therapy.  PLAN FOR NEXT SESSION: Check HEP progress, STM/DN, consider traction if needed for radicular symptoms, stretching and strengthening to tolerance.   Jervon Ream April Ma L Axel Meas, PT,  DPT 04/01/2022, 9:21 AM

## 2022-04-04 ENCOUNTER — Ambulatory Visit: Payer: BC Managed Care – PPO | Admitting: Rehabilitative and Restorative Service Providers"

## 2022-04-04 ENCOUNTER — Encounter: Payer: Self-pay | Admitting: Rehabilitative and Restorative Service Providers"

## 2022-04-04 DIAGNOSIS — I10 Essential (primary) hypertension: Secondary | ICD-10-CM | POA: Diagnosis not present

## 2022-04-04 DIAGNOSIS — M5459 Other low back pain: Secondary | ICD-10-CM

## 2022-04-04 DIAGNOSIS — M5416 Radiculopathy, lumbar region: Secondary | ICD-10-CM | POA: Diagnosis not present

## 2022-04-04 DIAGNOSIS — R29898 Other symptoms and signs involving the musculoskeletal system: Secondary | ICD-10-CM | POA: Diagnosis not present

## 2022-04-04 DIAGNOSIS — R531 Weakness: Secondary | ICD-10-CM | POA: Diagnosis not present

## 2022-04-04 DIAGNOSIS — Z7409 Other reduced mobility: Secondary | ICD-10-CM | POA: Diagnosis not present

## 2022-04-04 DIAGNOSIS — M545 Low back pain, unspecified: Secondary | ICD-10-CM | POA: Diagnosis not present

## 2022-04-04 NOTE — Therapy (Signed)
OUTPATIENT PHYSICAL THERAPY TREATMENT   Patient Name: Ricardo Mccormick MRN: [id number] DOB:03-26-1956, 66 y.o., male Today's Date: 04/04/2022   PT End of Session - 04/04/22 1147     Visit Number 3    Number of Visits 16    Date for PT Re-Evaluation 05/29/22    Authorization Type [employer] 30 visits/ year (25 remaining)    PT Start Time 1149    PT Stop Time 1230    PT Time Calculation (min) 41 min    Activity Tolerance Patient tolerated treatment well    Behavior During Therapy WFL for tasks assessed/performed              Past Medical History:  Diagnosis Date   Arthritis of both knees 09/26/2013   Patellofemoral chondromalacia and medial joint line arthritis of the right knee. Posttraumatic arthritis of the left knee    Diabetes mellitus without complication (HCC)    HLD (hyperlipidemia)    HTN (hypertension)    Past Surgical History:  Procedure Laterality Date   COLONOSCOPY     KNEE SURGERY Left    1980 &1992   Patient Active Problem List   Diagnosis Date Noted   Arthritis of both knees 09/26/2013    PCP: Valetta Close, MD REFERRING PROVIDER: Ceasar Lund, MD REFERRING DIAG: Lumbar Radiculopathy Rationale for Evaluation and Treatment: Rehabilitation  THERAPY DIAG:  Other low back pain  Other symptoms and signs involving the musculoskeletal system  ONSET DATE: 03/19/22  SUBJECTIVE:                                                                                                                                                                                           SUBJECTIVE STATEMENT: The patient woke with pain yesterday and arrives today limping due to significant left hip pain.  PERTINENT HISTORY:  L TKR-- no lateral sensation at knee and lateral lower leg, HTN, prediabetic, HLD  PAIN:  Are you having pain? Yes: NPRS scale: 8/10 Pain location: L glut med region Pain description: achiness, squeezing sensation in thigh Aggravating factors:  sitting Relieving factors: standing, walking, laying on right side  PRECAUTIONS: None  WEIGHT BEARING RESTRICTIONS: No  FALLS:  Has patient fallen in last 6 months? No  PATIENT GOALS: reduce pain  OBJECTIVE:   PALPATION: Quadratus lumborum L tender to palpation, L glut med and L piriformis painful to palpation  LUMBAR ROM:   AROM eval  Flexion 50% limited Most painful  Extension 50% limitation  Right lateral flexion 50% limitation  Left lateral flexion 50% limitation  Right rotation 50% limitation Pain referred into groin  Left rotation 50% limitation   (  Blank rows = not tested)  LOWER EXTREMITY MMT:    MMT Right eval Left eval  Hip flexion 5/5 5/5  Knee flexion 5/5 5/5  Knee extension 5/5 5/5  Ankle dorsiflexion 5/5 5/5  Ankle plantarflexion 5/5 4/5   (Blank rows = not tested)  LUMBAR SPECIAL TESTS:  Straight leg raise test: Produces pain in L thigh with R SLR Thomas test: produces pain in L posterior hip  [facility] Adult PT Treatment:                                                DATE: 04/04/22 Therapeutic Exercise: Resisted supine hip flexion isometrically x 3 reps R and L Primal push attempted, but stopped due to pain Quadriped cat/cow for AROM and mobilization Manual Therapy: PA mobs over L SI joint with increased pain into L hip and posterior/lateral thigh Passive knee flexion with hip IR/ER with STM in L glut and hip Self STM L glut medius with ball and muscle roller Passive sidelying hip flexor stretch Modalities: Traction: lumbar x 40 lbs/25lbs in 60/20 ratios x 15 minutes  [facility] Adult PT Treatment:                                                DATE: 04/01/22 Therapeutic Exercise: Treadmill warm up 2.0 mph x 5 min Sitting Hamstring stretch 2x30 sec R&L Hip flexor stretch x30 sec Piriformis stretch x30 sec Standing Hip flexor stretch on stairs x30 sec "L" stretch x30 sec with lateral flexion to the right x30 sec Sidelying Side plank 2x30 sec R&L  forearms and knees Prone Plank 2x30 sec forearms and knees Manual Therapy: STM L glut and piriformis and quadratus lumborum Trigger Point Dry-Needling  Treatment instructions: Expect mild to moderate muscle soreness. Patient Consent Given: Yes Education handout provided: Yes Muscles treated: L glut med, L pirifomris, L lumbar paraspinals and QL Treatment response/outcome: palpable lengthening and reduced pain Modalities: Heat x 10 min at end of session   [facility] Adult PT Treatment:                                                DATE: 03/30/22 Therapeutic Exercise: See HEP below Manual Therapy: STM L glut and piriformis Trigger Point Dry-Needling  Treatment instructions: Expect mild to moderate muscle soreness. Patient Consent Given: Yes Education handout provided: Yes Muscles treated: L glut med, L pirifomris Treatment response/outcome: palpable lengthening and reduced pain Modalities: Heat and e-stim interferential to tolerance L glut  PATIENT EDUCATION:  Education details: dry needling handout, HEP Person educated: Patient Education method: Explanation, Demonstration, and Handouts Education comprehension: verbalized understanding, returned demonstration, and needs further education  HOME EXERCISE PROGRAM: Access Code: [email or web address] URL: [email or web address] Date: 03/30/2022 Prepared by: Margretta Ditty  Exercises - Lying Prone  - 2 x daily - 7 x weekly - 1 sets - 1 reps - 5 minutes hold - Supine Piriformis Stretch with Foot on Ground  - 2 x daily - 7 x weekly - 1 sets - 3 reps - 20-30 seconds hold - Supine Single Knee to Chest Stretch  -  2 x daily - 7 x weekly - 1 sets - 3 reps - 20-30 seconds hold - sidelying low back stretch  - 2 x daily - 7 x weekly - 1 sets - 3 reps - 20-30 seconds hold  ASSESSMENT:  CLINICAL IMPRESSION: Patient felt improvement last week. He awoke yesterday with worsening L lumbar/L hip pain and arrives today with antalgic pattern. He  has 8/10 pain in standing after rising, and it goes down to 1/10 when supine with there ex. After traction, it is 1/10 with walking. PT to continue to progress to patient tolerance and modify as needed.   OBJECTIVE IMPAIRMENTS: decreased mobility, decreased ROM, decreased strength, hypomobility, increased fascial restrictions, impaired flexibility, impaired sensation, and pain.   GOALS: Goals reviewed with patient? Yes  SHORT TERM GOALS: Target date: 04/29/2022  Independent with initial HEP Goal status: IN PROGRESS  2.  The patient will report pain at rest < or equal to 3/10. Baseline:7/10 Goal status: IN PROGRESS   LONG TERM GOALS: Target date: 05/27/2022  Independent with final HEP Goal status:IN PROGRESS  2.  The patient will be able to return to full day of work. Baseline: leaves after 4 hours Goal status: IN PROGRESS  3. The patient will improve A/ROM lumbar flexion to 75% full range. Baseline: 50% with pain Goal status: IN PROGRESS  4.  The patient will report no pain at rest. Baseline:7/10 Goal status: IN PROGRESS  PLAN:  PT FREQUENCY: 2x/week  PT DURATION: 8 weeks  PLANNED INTERVENTIONS: Therapeutic exercises, Therapeutic activity, Neuromuscular re-education, Balance training, Gait training, Patient/Family education, Self Care, Joint mobilization, Dry Needling, Electrical stimulation, Spinal mobilization, Moist heat, Traction, and Manual therapy.  PLAN FOR NEXT SESSION: Check HEP progress, STM/DN* consider e-stim with DN, consider traction if needed for radicular symptoms, stretching and strengthening to tolerance.   Keondrick Dilks, PT 04/04/2022, 11:47 AM

## 2022-04-06 ENCOUNTER — Ambulatory Visit: Payer: BC Managed Care – PPO | Admitting: Rehabilitative and Restorative Service Providers"

## 2022-04-06 ENCOUNTER — Encounter: Payer: Self-pay | Admitting: Rehabilitative and Restorative Service Providers"

## 2022-04-06 DIAGNOSIS — R29898 Other symptoms and signs involving the musculoskeletal system: Secondary | ICD-10-CM

## 2022-04-06 DIAGNOSIS — M5416 Radiculopathy, lumbar region: Secondary | ICD-10-CM | POA: Diagnosis not present

## 2022-04-06 DIAGNOSIS — R531 Weakness: Secondary | ICD-10-CM | POA: Diagnosis not present

## 2022-04-06 DIAGNOSIS — M5459 Other low back pain: Secondary | ICD-10-CM

## 2022-04-06 DIAGNOSIS — Z7409 Other reduced mobility: Secondary | ICD-10-CM | POA: Diagnosis not present

## 2022-04-06 DIAGNOSIS — M545 Low back pain, unspecified: Secondary | ICD-10-CM | POA: Diagnosis not present

## 2022-04-06 DIAGNOSIS — I10 Essential (primary) hypertension: Secondary | ICD-10-CM | POA: Diagnosis not present

## 2022-04-06 NOTE — Therapy (Signed)
OUTPATIENT PHYSICAL THERAPY TREATMENT   Patient Name: Clemens Lachman MRN: [id number] DOB:1956/04/14, 66 y.o., male Today's Date: 04/06/2022   PT End of Session - 04/06/22 0930     Visit Number 4    Number of Visits 16    Date for PT Re-Evaluation 05/29/22    Authorization Type [employer] 30 visits/ year (25 remaining)    PT Start Time 0931    PT Stop Time 1015    PT Time Calculation (min) 44 min    Activity Tolerance Patient tolerated treatment well    Behavior During Therapy WFL for tasks assessed/performed               Past Medical History:  Diagnosis Date   Arthritis of both knees 09/26/2013   Patellofemoral chondromalacia and medial joint line arthritis of the right knee. Posttraumatic arthritis of the left knee    Diabetes mellitus without complication (HCC)    HLD (hyperlipidemia)    HTN (hypertension)    Past Surgical History:  Procedure Laterality Date   COLONOSCOPY     KNEE SURGERY Left    1980 &1992   Patient Active Problem List   Diagnosis Date Noted   Arthritis of both knees 09/26/2013    PCP: Valetta Close, MD REFERRING PROVIDER: Ceasar Lund, MD REFERRING DIAG: Lumbar Radiculopathy Rationale for Evaluation and Treatment: Rehabilitation  THERAPY DIAG:  Other low back pain  Other symptoms and signs involving the musculoskeletal system  ONSET DATE: 03/19/22  SUBJECTIVE:                                                                                                                                                                                           SUBJECTIVE STATEMENT: The patient arrives today limping with L hip pain. He notes traction did reduce pain, but by the time he got back to work he had radiating pain. He had intermittent radiating pain yesterday and woke with worsening pain today.  Pain while on nustep is a 2/10 and while driving here was a 6/83. Lying on his back, right side, or standing are ways he can reduce pain.   PERTINENT  HISTORY:  L TKR-- no lateral sensation at knee and lateral lower leg, HTN, prediabetic, HLD  PAIN:  Are you having pain? Yes: NPRS scale: 6/10 Pain location: L glut med region Pain description: achiness, squeezing sensation in thigh Aggravating factors: sitting Relieving factors: standing, walking, laying on right side Pain is relieved with heating pad.   PRECAUTIONS: None  WEIGHT BEARING RESTRICTIONS: No  FALLS:  Has patient fallen in last 6 months? No  PATIENT GOALS: reduce pain  OBJECTIVE:  PALPATION: Quadratus lumborum L tender to palpation, L glut med and L piriformis painful to palpation  LUMBAR ROM:   AROM eval  Flexion 50% limited Most painful  Extension 50% limitation  Right lateral flexion 50% limitation  Left lateral flexion 50% limitation  Right rotation 50% limitation Pain referred into groin  Left rotation 50% limitation   (Blank rows = not tested)  LOWER EXTREMITY MMT:    MMT Right eval Left eval  Hip flexion 5/5 5/5  Knee flexion 5/5 5/5  Knee extension 5/5 5/5  Ankle dorsiflexion 5/5 5/5  Ankle plantarflexion 5/5 4/5   (Blank rows = not tested)  LUMBAR SPECIAL TESTS:  Straight leg raise test: Produces pain in L thigh with R SLR Thomas test: produces pain in L posterior hip   [facility] Adult PT Treatment:                                                DATE: 04/06/22 Therapeutic Exercise: Nu-step level 5 x 4 minutes for warm up Supine  Double knee to chest with rocking increases pain-- stopped Bent knee fallouts x 15 reps L and R sides HS stretch with strap increases pain, so did P/ROM with mild L hip discomfort Marching x 10 reps SLR x 12 reps R and L sides with TA activation Manual Therapy: STM L glut medius and maximus as well as lateral border of the SI joint on the left side Trigger Point Dry-Needling  Patient Consent Given: Yes Education handout provided: Yes Muscles treated: glut medius Electrical stimulation performed:  Yes Parameters:  to tolerance one channel Treatment response/outcome: palpable   [facility] Adult PT Treatment:                                                DATE: 04/04/22 Therapeutic Exercise: Resisted supine hip flexion isometrically x 3 reps R and L Primal push attempted, but stopped due to pain Quadriped cat/cow for AROM and mobilization Manual Therapy: PA mobs over L SI joint with increased pain into L hip and posterior/lateral thigh Passive knee flexion with hip IR/ER with STM in L glut and hip Self STM L glut medius with ball and muscle roller Passive sidelying hip flexor stretch Modalities: Traction: lumbar x 40 lbs/25lbs in 60/20 ratios x 15 minutes  [facility] Adult PT Treatment:                                                DATE: 04/01/22 Therapeutic Exercise: Treadmill warm up 2.0 mph x 5 min Sitting Hamstring stretch 2x30 sec R&L Hip flexor stretch x30 sec Piriformis stretch x30 sec Standing Hip flexor stretch on stairs x30 sec "L" stretch x30 sec with lateral flexion to the right x30 sec Sidelying Side plank 2x30 sec R&L forearms and knees Prone Plank 2x30 sec forearms and knees Manual Therapy: STM L glut and piriformis and quadratus lumborum Trigger Point Dry-Needling  Treatment instructions: Expect mild to moderate muscle soreness. Patient Consent Given: Yes Education handout provided: Yes Muscles treated: L glut med, L pirifomris, L lumbar paraspinals and QL Treatment response/outcome:  palpable lengthening and reduced pain Modalities: Heat x 10 min at end of session  PATIENT EDUCATION:  Education details: dry needling handout, HEP Person educated: Patient Education method: Explanation, Demonstration, and Handouts Education comprehension: verbalized understanding, returned demonstration, and needs further education  HOME EXERCISE PROGRAM: Access Code: [email or web address] URL: [email or web address] Date: 04/06/2022 Prepared by: Margretta Ditty  Exercises - Supine Piriformis Stretch with Foot on Ground  - 2 x daily - 7 x weekly - 1 sets - 3 reps - 20-30 seconds hold - Supine Active Straight Leg Raise  - 2 x daily - 7 x weekly - 1 sets - 10 reps - Supine Sciatic Nerve Glide  - 2 x daily - 7 x weekly - 1 sets - 10 reps - Sidelying Hip Abduction  - 2 x daily - 7 x weekly - 1 sets - 10 reps - Standing 'L' Stretch at Counter  - 1 x daily - 7 x weekly - 2 sets - 30 sec hold - Seated Hip Flexor Stretch  - 1 x daily - 7 x weekly - 2 sets - 30 sec hold - Hip Flexor Stretch with Chair  - 1 x daily - 7 x weekly - 2 sets - 30 sec hold  ASSESSMENT:  CLINICAL IMPRESSION: The patient and PT modified HEP to have therex that does not flare pain. Patient gets relief with dry needling with estim having no pain or antalgic gait at end of session. Plan to continue to progress strengthening to tolerance and use modalities to reduce irritability.  OBJECTIVE IMPAIRMENTS: decreased mobility, decreased ROM, decreased strength, hypomobility, increased fascial restrictions, impaired flexibility, impaired sensation, and pain.   GOALS: Goals reviewed with patient? Yes  SHORT TERM GOALS: Target date: 04/29/2022  Independent with initial HEP Goal status: IN PROGRESS  2.  The patient will report pain at rest < or equal to 3/10. Baseline:7/10 Goal status: IN PROGRESS   LONG TERM GOALS: Target date: 05/27/2022  Independent with final HEP Goal status:IN PROGRESS  2.  The patient will be able to return to full day of work. Baseline: leaves after 4 hours Goal status: IN PROGRESS  3. The patient will improve A/ROM lumbar flexion to 75% full range. Baseline: 50% with pain Goal status: IN PROGRESS  4.  The patient will report no pain at rest. Baseline:7/10 Goal status: IN PROGRESS  PLAN:  PT FREQUENCY: 2x/week  PT DURATION: 8 weeks  PLANNED INTERVENTIONS: Therapeutic exercises, Therapeutic activity, Neuromuscular re-education, Balance  training, Gait training, Patient/Family education, Self Care, Joint mobilization, Dry Needling, Electrical stimulation, Spinal mobilization, Moist heat, Traction, and Manual therapy.  PLAN FOR NEXT SESSION: Check HEP progress, STM/DN* consider e-stim with DN, consider traction if needed for radicular symptoms, stretching and strengthening to tolerance.   Vidyuth Belsito, PT 04/06/2022, 10:09 AM

## 2022-04-11 ENCOUNTER — Encounter: Payer: Self-pay | Admitting: Rehabilitative and Restorative Service Providers"

## 2022-04-11 ENCOUNTER — Ambulatory Visit: Payer: BC Managed Care – PPO | Admitting: Rehabilitative and Restorative Service Providers"

## 2022-04-11 DIAGNOSIS — M545 Low back pain, unspecified: Secondary | ICD-10-CM | POA: Diagnosis not present

## 2022-04-11 DIAGNOSIS — R29898 Other symptoms and signs involving the musculoskeletal system: Secondary | ICD-10-CM

## 2022-04-11 DIAGNOSIS — R531 Weakness: Secondary | ICD-10-CM | POA: Diagnosis not present

## 2022-04-11 DIAGNOSIS — M5416 Radiculopathy, lumbar region: Secondary | ICD-10-CM | POA: Diagnosis not present

## 2022-04-11 DIAGNOSIS — M5459 Other low back pain: Secondary | ICD-10-CM

## 2022-04-11 DIAGNOSIS — Z7409 Other reduced mobility: Secondary | ICD-10-CM | POA: Diagnosis not present

## 2022-04-11 DIAGNOSIS — I10 Essential (primary) hypertension: Secondary | ICD-10-CM | POA: Diagnosis not present

## 2022-04-11 NOTE — Therapy (Signed)
OUTPATIENT PHYSICAL THERAPY TREATMENT   Patient Name: Ricardo Mccormick MRN: [id number] DOB:06-25-1955, 66 y.o., male Today's Date: 04/11/2022   PT End of Session - 04/11/22 0842     Visit Number 5    Number of Visits 16    Date for PT Re-Evaluation 05/29/22    Authorization Type [employer] 30 visits/ year    PT Start Time 0843    PT Stop Time 0931    PT Time Calculation (min) 48 min    Activity Tolerance Patient tolerated treatment well               Past Medical History:  Diagnosis Date   Arthritis of both knees 09/26/2013   Patellofemoral chondromalacia and medial joint line arthritis of the right knee. Posttraumatic arthritis of the left knee    Diabetes mellitus without complication (HCC)    HLD (hyperlipidemia)    HTN (hypertension)    Past Surgical History:  Procedure Laterality Date   COLONOSCOPY     KNEE SURGERY Left    1980 &1992   Patient Active Problem List   Diagnosis Date Noted   Arthritis of both knees 09/26/2013    PCP: Valetta Close, MD REFERRING PROVIDER: Ceasar Lund, MD REFERRING DIAG: Lumbar Radiculopathy Rationale for Evaluation and Treatment: Rehabilitation  THERAPY DIAG:  Other low back pain  Other symptoms and signs involving the musculoskeletal system  ONSET DATE: 03/19/22  SUBJECTIVE:                                                                                                                                                                                           SUBJECTIVE STATEMENT: Patient reports that the dry needling last visit. He had a good weekend and was taking less pain medication. Limping less with walking. Walking seems to help.    PERTINENT HISTORY:  L TKR-- no lateral sensation at knee and lateral lower leg, HTN, prediabetic, HLD  PAIN:  Are you having pain? Yes: NPRS scale: 5/10 Pain location: L glut med region to anterior thigh Pain description: achiness, squeezing sensation in thigh Aggravating factors:  sitting Relieving factors: standing, walking, laying on right side Pain is relieved with heating pad.   PRECAUTIONS: None  WEIGHT BEARING RESTRICTIONS: No  FALLS:  Has patient fallen in last 6 months? No  PATIENT GOALS: reduce pain  OBJECTIVE:   PALPATION: Quadratus lumborum L tender to palpation, L glut med and L piriformis painful to palpation  LUMBAR ROM:   AROM eval  Flexion 50% limited Most painful  Extension 50% limitation  Right lateral flexion 50% limitation  Left lateral flexion 50% limitation  Right rotation  50% limitation Pain referred into groin  Left rotation 50% limitation   (Blank rows = not tested)  LOWER EXTREMITY MMT:    MMT Right eval Left eval  Hip flexion 5/5 5/5  Knee flexion 5/5 5/5  Knee extension 5/5 5/5  Ankle dorsiflexion 5/5 5/5  Ankle plantarflexion 5/5 4/5   (Blank rows = not tested)  LUMBAR SPECIAL TESTS:  Straight leg raise test: Produces pain in L thigh with R SLR Thomas test: produces pain in L posterior hip   [facility] Adult PT Treatment:                                                DATE: 04/11/22:  Treadmill - 1.8 mph x 4 min   Trigger Point Dry-Needling  Patient Consent Given: Yes Education handout provided: previously provided Muscles treated: QL; piriformis; glut max, glut medius; glut min  Electrical stimulation performed: Yes Parameters: mAmp current to pt tolerance  Treatment response/outcome: decreased palpable tightness     THERAPEUTIC EXERCISES:   Prone lying   Piriformis stretch 30 sec x 3 travell stretch   4 part core 10 sec x 10   04/06/22 Therapeutic Exercise: Nu-step level 5 x 4 minutes for warm up Supine  Double knee to chest with rocking increases pain-- stopped Bent knee fallouts x 15 reps L and R sides HS stretch with strap increases pain, so did P/ROM with mild L hip discomfort Marching x 10 reps SLR x 12 reps R and L sides with TA activation Manual Therapy: STM L glut medius and maximus as well  as lateral border of the SI joint on the left side Trigger Point Dry-Needling  Patient Consent Given: Yes Education handout provided: Yes Muscles treated: glut medius Electrical stimulation performed: Yes Parameters:  to tolerance one channel Treatment response/outcome: palpable   [facility] Adult PT Treatment:                                                DATE: 04/04/22 Therapeutic Exercise: Resisted supine hip flexion isometrically x 3 reps R and L Primal push attempted, but stopped due to pain Quadriped cat/cow for AROM and mobilization Manual Therapy: PA mobs over L SI joint with increased pain into L hip and posterior/lateral thigh Passive knee flexion with hip IR/ER with STM in L glut and hip Self STM L glut medius with ball and muscle roller Passive sidelying hip flexor stretch Modalities: Traction: lumbar x 40 lbs/25lbs in 60/20 ratios x 15 minutes  [facility] Adult PT Treatment:                                                DATE: 04/01/22 Therapeutic Exercise: Treadmill warm up 2.0 mph x 5 min Sitting Hamstring stretch 2x30 sec R&L Hip flexor stretch x30 sec Piriformis stretch x30 sec Standing Hip flexor stretch on stairs x30 sec "L" stretch x30 sec with lateral flexion to the right x30 sec Sidelying Side plank 2x30 sec R&L forearms and knees Prone Plank 2x30 sec forearms and knees Manual Therapy: STM L glut and piriformis and quadratus lumborum  Trigger Point Dry-Needling  Treatment instructions: Expect mild to moderate muscle soreness. Patient Consent Given: Yes Education handout provided: Yes Muscles treated: L glut med, L pirifomris, L lumbar paraspinals and QL Treatment response/outcome: palpable lengthening and reduced pain Modalities: Heat x 10 min at end of session  PATIENT EDUCATION:  Education details: dry needling handout, HEP Person educated: Patient Education method: Explanation, Demonstration, and Handouts Education comprehension: verbalized  understanding, returned demonstration, and needs further education  HOME EXERCISE PROGRAM: Access Code: [email or web address] URL: [email or web address] Date: 04/11/2022 Prepared by: Corlis Leak  Exercises - Supine Piriformis Stretch with Foot on Ground  - 2 x daily - 7 x weekly - 1 sets - 3 reps - 20-30 seconds hold - Supine Active Straight Leg Raise  - 2 x daily - 7 x weekly - 1 sets - 10 reps - Supine Sciatic Nerve Glide  - 2 x daily - 7 x weekly - 1 sets - 10 reps - Sidelying Hip Abduction  - 2 x daily - 7 x weekly - 1 sets - 10 reps - Standing 'L' Stretch at Counter  - 1 x daily - 7 x weekly - 2 sets - 30 sec hold - Seated Hip Flexor Stretch  - 1 x daily - 7 x weekly - 2 sets - 30 sec hold - Hip Flexor Stretch with Chair  - 1 x daily - 7 x weekly - 2 sets - 30 sec hold - Supine Piriformis Stretch with Leg Straight  - 2 x daily - 7 x weekly - 1 sets - 3 reps - 30 sec  hold - Supine Transversus Abdominis Bracing with Pelvic Floor Contraction  - 2 x daily - 7 x weekly - 1 sets - 10 reps - 10sec  hold  ASSESSMENT:  CLINICAL IMPRESSION: Excellent response to DN with significant decrease in pain for the past several days. Patient positive response to dry needling with estim having no pain or antalgic gait at end of session.  Note tightness through the Rt QL, lumbar paraspinals; piriformis; gluts; psoas. Plan to continue to progress strengthening to tolerance and use modalities to reduce irritability.  OBJECTIVE IMPAIRMENTS: decreased mobility, decreased ROM, decreased strength, hypomobility, increased fascial restrictions, impaired flexibility, impaired sensation, and pain.   GOALS: Goals reviewed with patient? Yes  SHORT TERM GOALS: Target date: 04/29/2022  Independent with initial HEP Goal status: IN PROGRESS  2.  The patient will report pain at rest < or equal to 3/10. Baseline:7/10 Goal status: IN PROGRESS   LONG TERM GOALS: Target date: 05/27/2022  Independent with final  HEP Goal status:IN PROGRESS  2.  The patient will be able to return to full day of work. Baseline: leaves after 4 hours Goal status: IN PROGRESS  3. The patient will improve A/ROM lumbar flexion to 75% full range. Baseline: 50% with pain Goal status: IN PROGRESS  4.  The patient will report no pain at rest. Baseline:7/10 Goal status: IN PROGRESS  PLAN:  PT FREQUENCY: 2x/week  PT DURATION: 8 weeks  PLANNED INTERVENTIONS: Therapeutic exercises, Therapeutic activity, Neuromuscular re-education, Balance training, Gait training, Patient/Family education, Self Care, Joint mobilization, Dry Needling, Electrical stimulation, Spinal mobilization, Moist heat, Traction, and Manual therapy.  PLAN FOR NEXT SESSION: Continue HEP progression, STM/DN with DN, consider traction if needed for radicular symptoms, stretching and strengthening to tolerance.   Val Riles, PT, MPH  04/11/2022, 8:44 AM

## 2022-04-13 ENCOUNTER — Encounter: Payer: Self-pay | Admitting: Rehabilitative and Restorative Service Providers"

## 2022-04-13 ENCOUNTER — Ambulatory Visit: Payer: BC Managed Care – PPO | Admitting: Rehabilitative and Restorative Service Providers"

## 2022-04-13 DIAGNOSIS — M5416 Radiculopathy, lumbar region: Secondary | ICD-10-CM | POA: Diagnosis not present

## 2022-04-13 DIAGNOSIS — M5459 Other low back pain: Secondary | ICD-10-CM

## 2022-04-13 DIAGNOSIS — R29898 Other symptoms and signs involving the musculoskeletal system: Secondary | ICD-10-CM | POA: Diagnosis not present

## 2022-04-13 DIAGNOSIS — Z7409 Other reduced mobility: Secondary | ICD-10-CM | POA: Diagnosis not present

## 2022-04-13 DIAGNOSIS — I10 Essential (primary) hypertension: Secondary | ICD-10-CM | POA: Diagnosis not present

## 2022-04-13 DIAGNOSIS — M545 Low back pain, unspecified: Secondary | ICD-10-CM | POA: Diagnosis not present

## 2022-04-13 DIAGNOSIS — R531 Weakness: Secondary | ICD-10-CM | POA: Diagnosis not present

## 2022-04-13 NOTE — Therapy (Signed)
OUTPATIENT PHYSICAL THERAPY TREATMENT   Patient Name: Ricardo Mccormick MRN: [id number] DOB:03/01/1956, 66 y.o., male Today's Date: 04/13/2022   PT End of Session - 04/13/22 0848     Visit Number 6    Number of Visits 16    Date for PT Re-Evaluation 05/29/22    Authorization Type [employer] 30 visits/ year    PT Start Time 0845    PT Stop Time 0933    PT Time Calculation (min) 48 min    Activity Tolerance Patient tolerated treatment well               Past Medical History:  Diagnosis Date   Arthritis of both knees 09/26/2013   Patellofemoral chondromalacia and medial joint line arthritis of the right knee. Posttraumatic arthritis of the left knee    Diabetes mellitus without complication (HCC)    HLD (hyperlipidemia)    HTN (hypertension)    Past Surgical History:  Procedure Laterality Date   COLONOSCOPY     KNEE SURGERY Left    1980 &1992   Patient Active Problem List   Diagnosis Date Noted   Arthritis of both knees 09/26/2013    PCP: Valetta Close, MD REFERRING PROVIDER: Ceasar Lund, MD REFERRING DIAG: Lumbar Radiculopathy Rationale for Evaluation and Treatment: Rehabilitation  THERAPY DIAG:  Other low back pain  Other symptoms and signs involving the musculoskeletal system  ONSET DATE: 03/19/22  SUBJECTIVE:                                                                                                                                                                                           SUBJECTIVE STATEMENT: Patient reports that the dry needling probably helped last visit but he was nauseated for most of that day. It was mild nausea but noticeable. He was able to work all day yesterday which was the first time he was able to do that. Back and LE "really feels good today; best it has felt in a long time" Limping less with walking. Walking seems to help.    PERTINENT HISTORY:  L TKR-- no lateral sensation at knee and lateral lower leg, HTN, prediabetic,  HLD  PAIN:  Are you having pain? Yes: NPRS scale: 2/10 Pain location: L glut med region to anterior thigh Pain description: achiness, squeezing sensation in thigh Aggravating factors: sitting Relieving factors: standing, walking, laying on right side Pain is relieved with heating pad.   PRECAUTIONS: None  WEIGHT BEARING RESTRICTIONS: No  FALLS:  Has patient fallen in last 6 months? No  PATIENT GOALS: reduce pain  OBJECTIVE:   PALPATION: Quadratus lumborum L tender to palpation, L  glut med and L piriformis painful to palpation  LUMBAR ROM:   AROM eval  Flexion 50% limited Most painful  Extension 50% limitation  Right lateral flexion 50% limitation  Left lateral flexion 50% limitation  Right rotation 50% limitation Pain referred into groin  Left rotation 50% limitation   (Blank rows = not tested)  LOWER EXTREMITY MMT:    MMT Right eval Left eval  Hip flexion 5/5 5/5  Knee flexion 5/5 5/5  Knee extension 5/5 5/5  Ankle dorsiflexion 5/5 5/5  Ankle plantarflexion 5/5 4/5   (Blank rows = not tested)  LUMBAR SPECIAL TESTS:  Straight leg raise test: Produces pain in L thigh with R SLR Thomas test: produces pain in L posterior hip   [facility] Adult PT Treatment:                                                DATE:  04/13/22:  Therapeutic Exercise:   Treadmill 1.2 to 2.0 x 6 min for warmup Transverse abdominals 10 sec x 10 reps  Nerve glide supine x 10  Hamstring stretch supine with strap 30 sec x 3  Piriformis stretch 30 sec x 3 Lt supine travell  Quad stretch prone with strap 30 sec x 3  Hip flexor stretch sitting 30 sec x 3  Row blue TB x 10  Shoulder extension blue TB x 10  Antirotation blue TB one strip x 10 each side   Manual Therapy: STM Lt iliopsoas musculature pt in supine LE's on bolster Lumbar mobs Grade II/III  PA mobs Lt GT Grade II/III; IR/ER LE hip ext, knee flexed Modalities:   Moist heat lumbar spine; Lt hip flexors x 10 min  04/11/22:   Treadmill - 1.8 mph x 4 min   Trigger Point Dry-Needling  Patient Consent Given: Yes Education handout provided: previously provided Muscles treated: QL; piriformis; glut max, glut medius; glut min  Electrical stimulation performed: Yes Parameters: mAmp current to pt tolerance  Treatment response/outcome: decreased palpable tightness     THERAPEUTIC EXERCISES:   Prone lying   Piriformis stretch 30 sec x 3 travell stretch   4 part core 10 sec x 10    PATIENT EDUCATION:  Education details: dry needling handout, HEP Person educated: Patient Education method: Explanation, Demonstration, and Handouts Education comprehension: verbalized understanding, returned demonstration, and needs further education  HOME EXERCISE PROGRAM: Access Code: [email or web address] URL: [email or web address] Date: 04/13/2022 Prepared by: Corlis Leak  Exercises - Supine Transversus Abdominis Bracing with Pelvic Floor Contraction  - 2 x daily - 7 x weekly - 1 sets - 10 reps - 10sec  hold - Supine Sciatic Nerve Glide  - 2 x daily - 7 x weekly - 1 sets - 10 reps - Hooklying Hamstring Stretch with Strap  - 2 x daily - 7 x weekly - 1 sets - 3 reps - 30 sec  hold - Supine Piriformis Stretch with Leg Straight  - 2 x daily - 7 x weekly - 1 sets - 3 reps - 30 sec  hold - Prone Quadriceps Stretch with Strap  - 2 x daily - 7 x weekly - 1 sets - 3 reps - 30 sec  hold - Seated Hip Flexor Stretch  - 1 x daily - 7 x weekly - 2 sets - 30 sec hold - Standing Bilateral Low Shoulder Row  with Anchored Resistance  - 2 x daily - 7 x weekly - 1-3 sets - 10 reps - 2-3 sec  hold - Shoulder extension with resistance - Neutral  - 1 x daily - 7 x weekly - 1-2 sets - 10 reps - 3-5 sec  hold - Anti-Rotation Lateral Stepping with Press  - 2 x daily - 7 x weekly - 1-2 sets - 10 reps - 2-3 sec  hold  ASSESSMENT:  CLINICAL IMPRESSION: Good response to DN with significant decrease in pain for and increased tolerance for functional  activities including longer time at work. Note tightness through the Rt QL, lumbar paraspinals; piriformis; gluts; psoas. Plan to continue to progress strengthening to tolerance and use modalities as needed to reduce irritability.  OBJECTIVE IMPAIRMENTS: decreased mobility, decreased ROM, decreased strength, hypomobility, increased fascial restrictions, impaired flexibility, impaired sensation, and pain.   GOALS: Goals reviewed with patient? Yes  SHORT TERM GOALS: Target date: 04/29/2022  Independent with initial HEP Goal status: IN PROGRESS  2.  The patient will report pain at rest < or equal to 3/10. Baseline:7/10 Goal status: IN PROGRESS   LONG TERM GOALS: Target date: 05/27/2022  Independent with final HEP Goal status:IN PROGRESS  2.  The patient will be able to return to full day of work. Baseline: leaves after 4 hours Goal status: IN PROGRESS  3. The patient will improve A/ROM lumbar flexion to 75% full range. Baseline: 50% with pain Goal status: IN PROGRESS  4.  The patient will report no pain at rest. Baseline:7/10 Goal status: IN PROGRESS  PLAN:  PT FREQUENCY: 2x/week  PT DURATION: 8 weeks  PLANNED INTERVENTIONS: Therapeutic exercises, Therapeutic activity, Neuromuscular re-education, Balance training, Gait training, Patient/Family education, Self Care, Joint mobilization, Dry Needling, Electrical stimulation, Spinal mobilization, Moist heat, Traction, and Manual therapy.  PLAN FOR NEXT SESSION: Continue HEP progression, STM/DN with DN, consider traction if needed for radicular symptoms, stretching and strengthening to tolerance.   Val Riles, PT, MPH  04/13/2022, 8:52 AM

## 2022-04-18 ENCOUNTER — Encounter: Payer: Self-pay | Admitting: Rehabilitative and Restorative Service Providers"

## 2022-04-18 ENCOUNTER — Ambulatory Visit: Payer: BC Managed Care – PPO | Attending: Anesthesiology | Admitting: Rehabilitative and Restorative Service Providers"

## 2022-04-18 DIAGNOSIS — R29898 Other symptoms and signs involving the musculoskeletal system: Secondary | ICD-10-CM | POA: Diagnosis not present

## 2022-04-18 DIAGNOSIS — M5459 Other low back pain: Secondary | ICD-10-CM | POA: Diagnosis not present

## 2022-04-18 NOTE — Therapy (Signed)
OUTPATIENT PHYSICAL THERAPY TREATMENT   Patient Name: Ricardo Mccormick MRN: [id number] DOB:07-17-1955, 66 y.o., male Today's Date: 04/18/2022   PT End of Session - 04/18/22 0856     Visit Number 7    Number of Visits 16    Date for PT Re-Evaluation 05/29/22    Authorization Type [employer] 30 visits/ year    PT Start Time 0845    PT Stop Time 0933    PT Time Calculation (min) 48 min    Activity Tolerance Patient tolerated treatment well               Past Medical History:  Diagnosis Date   Arthritis of both knees 09/26/2013   Patellofemoral chondromalacia and medial joint line arthritis of the right knee. Posttraumatic arthritis of the left knee    Diabetes mellitus without complication (HCC)    HLD (hyperlipidemia)    HTN (hypertension)    Past Surgical History:  Procedure Laterality Date   COLONOSCOPY     KNEE SURGERY Left    1980 &1992   Patient Active Problem List   Diagnosis Date Noted   Arthritis of both knees 09/26/2013    PCP: Valetta Close, MD REFERRING PROVIDER: Ceasar Lund, MD REFERRING DIAG: Lumbar Radiculopathy Rationale for Evaluation and Treatment: Rehabilitation  THERAPY DIAG:  Other low back pain  Other symptoms and signs involving the musculoskeletal system  ONSET DATE: 03/19/22  SUBJECTIVE:                                                                                                                                                                                           SUBJECTIVE STATEMENT: Patient reports that the dry needling seems to help. Now working all day. Still has increased symptoms    PERTINENT HISTORY:  L TKR-- no lateral sensation at knee and lateral lower leg, HTN, prediabetic, HLD  PAIN:  Are you having pain? Yes: NPRS scale: 1-2/10 Pain location: L glut med region to anterior thigh Pain description: achiness, squeezing sensation in thigh Aggravating factors: sitting Relieving factors: standing, walking, laying on  right side Pain is relieved with heating pad.   PRECAUTIONS: None  WEIGHT BEARING RESTRICTIONS: No  FALLS:  Has patient fallen in last 6 months? No  PATIENT GOALS: reduce pain  OBJECTIVE:   PALPATION: Quadratus lumborum L tender to palpation, L glut med and L piriformis painful to palpation  LUMBAR ROM:   AROM eval  Flexion 50% limited Most painful  Extension 50% limitation  Right lateral flexion 50% limitation  Left lateral flexion 50% limitation  Right rotation 50% limitation Pain referred into groin  Left rotation 50%  limitation   (Blank rows = not tested)  LOWER EXTREMITY MMT:    MMT Right eval Left eval  Hip flexion 5/5 5/5  Knee flexion 5/5 5/5  Knee extension 5/5 5/5  Ankle dorsiflexion 5/5 5/5  Ankle plantarflexion 5/5 4/5   (Blank rows = not tested)  LUMBAR SPECIAL TESTS:  Straight leg raise test: Produces pain in L thigh with R SLR Thomas test: produces pain in L posterior hip   [facility] Adult PT Treatment:                                                DATE:  04/18/22: Therapeutic Exercise:   Treadmill 1.2 to 2.0 x 6 min for warmup Transverse abdominals 10 sec x 10 reps  Nerve glide supine x 10  Hamstring stretch supine with strap 30 sec x 3  Piriformis stretch 30 sec x 3 Lt supine travell  Diagonal knee to chest 30 sec x 3 Quad stretch prone with strap 30 sec x 3  Hip flexor stretch sitting 30 sec x 3  Row blue TB x 10  Shoulder extension blue TB x 10  Antirotation blue TB one strip x 10 each side   Manual Therapy: STM Lt iliopsoas musculature pt in supine LE's on bolster Lumbar mobs Grade II/III  PA mobs Lt GT Grade II/III; IR/ER LE hip ext, knee flexed   Trigger Point Dry-Needling  Patient Consent Given: Yes Education handout provided: previously provided Muscles treated: QL; piriformis; glut max, glut medius; glut min  Electrical stimulation performed: Yes Parameters: mAmp current to pt tolerance  Treatment response/outcome: decreased  palpable tightness    Modalities:    Moist heat lumbar/hip and psoas x 10 min   04/13/22:  Therapeutic Exercise:   Treadmill 1.2 to 2.0 x 6 min for warmup Transverse abdominals 10 sec x 10 reps  Nerve glide supine x 10  Hamstring stretch supine with strap 30 sec x 3  Piriformis stretch 30 sec x 3 Lt supine travell  Quad stretch prone with strap 30 sec x 3  Hip flexor stretch sitting 30 sec x 3  Row blue TB x 10  Shoulder extension blue TB x 10  Antirotation blue TB one strip x 10 each side   Manual Therapy: STM Lt iliopsoas musculature pt in supine LE's on bolster Lumbar mobs Grade II/III  PA mobs Lt GT Grade II/III; IR/ER LE hip ext, knee flexed Modalities:   Moist heat lumbar spine; Lt hip flexors x 10 min    PATIENT EDUCATION:  Education details: dry needling handout, HEP Person educated: Patient Education method: Explanation, Demonstration, and Handouts Education comprehension: verbalized understanding, returned demonstration, and needs further education  HOME EXERCISE PROGRAM: Access Code: [email or web address] URL: [email or web address] Date: 04/13/2022 Prepared by: Corlis Leak  Exercises - Supine Transversus Abdominis Bracing with Pelvic Floor Contraction  - 2 x daily - 7 x weekly - 1 sets - 10 reps - 10sec  hold - Supine Sciatic Nerve Glide  - 2 x daily - 7 x weekly - 1 sets - 10 reps - Hooklying Hamstring Stretch with Strap  - 2 x daily - 7 x weekly - 1 sets - 3 reps - 30 sec  hold - Supine Piriformis Stretch with Leg Straight  - 2 x daily - 7 x weekly - 1 sets - 3 reps - 30  sec  hold - Prone Quadriceps Stretch with Strap  - 2 x daily - 7 x weekly - 1 sets - 3 reps - 30 sec  hold - Seated Hip Flexor Stretch  - 1 x daily - 7 x weekly - 2 sets - 30 sec hold - Standing Bilateral Low Shoulder Row with Anchored Resistance  - 2 x daily - 7 x weekly - 1-3 sets - 10 reps - 2-3 sec  hold - Shoulder extension with resistance - Neutral  - 1 x daily - 7 x weekly - 1-2 sets  - 10 reps - 3-5 sec  hold - Anti-Rotation Lateral Stepping with Press  - 2 x daily - 7 x weekly - 1-2 sets - 10 reps - 2-3 sec  hold  ASSESSMENT:  CLINICAL IMPRESSION: Continued improvement in pain and radicular symptoms. Good response to DN with decrease in pain for and increased tolerance for functional activities including longer time at work. Note tightness through the Rt QL, lumbar paraspinals; piriformis; gluts; psoas. Plan to continue to progress strengthening to tolerance and use modalities as needed to reduce irritability.  OBJECTIVE IMPAIRMENTS: decreased mobility, decreased ROM, decreased strength, hypomobility, increased fascial restrictions, impaired flexibility, impaired sensation, and pain.   GOALS: Goals reviewed with patient? Yes  SHORT TERM GOALS: Target date: 04/29/2022  Independent with initial HEP Goal status: IN PROGRESS  2.  The patient will report pain at rest < or equal to 3/10. Baseline:7/10 Goal status: IN PROGRESS   LONG TERM GOALS: Target date: 05/27/2022  Independent with final HEP Goal status:IN PROGRESS  2.  The patient will be able to return to full day of work. Baseline: leaves after 4 hours Goal status: IN PROGRESS  3. The patient will improve A/ROM lumbar flexion to 75% full range. Baseline: 50% with pain Goal status: IN PROGRESS  4.  The patient will report no pain at rest. Baseline:7/10 Goal status: IN PROGRESS  PLAN:  PT FREQUENCY: 2x/week  PT DURATION: 8 weeks  PLANNED INTERVENTIONS: Therapeutic exercises, Therapeutic activity, Neuromuscular re-education, Balance training, Gait training, Patient/Family education, Self Care, Joint mobilization, Dry Needling, Electrical stimulation, Spinal mobilization, Moist heat, Traction, and Manual therapy.  PLAN FOR NEXT SESSION: Continue HEP progression, STM/DN with DN, stretching and strengthening to tolerance; core stabilization; lifting   Yaremi Stahlman Rober Minion, PT, MPH  04/18/2022, 8:56 AM

## 2022-04-21 ENCOUNTER — Encounter: Payer: Self-pay | Admitting: Rehabilitative and Restorative Service Providers"

## 2022-04-21 ENCOUNTER — Ambulatory Visit: Payer: BC Managed Care – PPO | Admitting: Rehabilitative and Restorative Service Providers"

## 2022-04-21 DIAGNOSIS — R29898 Other symptoms and signs involving the musculoskeletal system: Secondary | ICD-10-CM | POA: Diagnosis not present

## 2022-04-21 DIAGNOSIS — M5459 Other low back pain: Secondary | ICD-10-CM | POA: Diagnosis not present

## 2022-04-21 NOTE — Therapy (Signed)
OUTPATIENT PHYSICAL THERAPY TREATMENT   Patient Name: Adarrius Graeff MRN: [id number] DOB:Jul 16, 1955, 66 y.o., male Today's Date: 04/21/2022   PT End of Session - 04/21/22 0852     Visit Number 8    Number of Visits 16    Date for PT Re-Evaluation 05/29/22    Authorization Type [employer] 30 visits/ year    PT Start Time 0848    PT Stop Time 0936    PT Time Calculation (min) 48 min    Activity Tolerance Patient tolerated treatment well               Past Medical History:  Diagnosis Date   Arthritis of both knees 09/26/2013   Patellofemoral chondromalacia and medial joint line arthritis of the right knee. Posttraumatic arthritis of the left knee    Diabetes mellitus without complication (HCC)    HLD (hyperlipidemia)    HTN (hypertension)    Past Surgical History:  Procedure Laterality Date   COLONOSCOPY     KNEE SURGERY Left    1980 &1992   Patient Active Problem List   Diagnosis Date Noted   Arthritis of both knees 09/26/2013    PCP: Valetta Close, MD REFERRING PROVIDER: Ceasar Lund, MD REFERRING DIAG: Lumbar Radiculopathy Rationale for Evaluation and Treatment: Rehabilitation  THERAPY DIAG:  Other low back pain  Other symptoms and signs involving the musculoskeletal system  ONSET DATE: 03/19/22  SUBJECTIVE:                                                                                                                                                                                           SUBJECTIVE STATEMENT: Patient reports that he coughed Monday night and felt pain in the Lt LB and pain into the anterior Lt thigh which has persisted. Still has increased symptoms    PERTINENT HISTORY:  L TKR-- no lateral sensation at knee and lateral lower leg, HTN, prediabetic, HLD  PAIN:  Are you having pain? Yes: NPRS scale: 4-5/10 Pain location: L glut med region to anterior thigh Pain description: achiness, squeezing sensation in thigh Aggravating factors:  sitting Relieving factors: standing, walking, laying on right side Pain is relieved with heating pad.   PRECAUTIONS: None  WEIGHT BEARING RESTRICTIONS: No  FALLS:  Has patient fallen in last 6 months? No  PATIENT GOALS: reduce pain  OBJECTIVE:   PALPATION: Quadratus lumborum L tender to palpation, L glut med and L piriformis painful to palpation  LUMBAR ROM:   AROM eval  Flexion 50% limited Most painful  Extension 50% limitation  Right lateral flexion 50% limitation  Left lateral flexion 50% limitation  Right  rotation 50% limitation Pain referred into groin  Left rotation 50% limitation   (Blank rows = not tested)  LOWER EXTREMITY MMT:    MMT Right eval Left eval  Hip flexion 5/5 5/5  Knee flexion 5/5 5/5  Knee extension 5/5 5/5  Ankle dorsiflexion 5/5 5/5  Ankle plantarflexion 5/5 4/5   (Blank rows = not tested)  LUMBAR SPECIAL TESTS:  Straight leg raise test: Produces pain in L thigh with R SLR Thomas test: produces pain in L posterior hip   [facility] Adult PT Treatment:                                                DATE:  04/21/22: Treadmill 1.2 to 2.0 x 4 min for warmup Transverse abdominals 10 sec x 10 reps  Nerve glide supine x 10  Hamstring stretch supine with strap 30 sec x 3  Piriformis stretch 30 sec x 3 Lt supine travell  Diagonal knee to chest 30 sec x 3 Hold today: Quad stretch prone with strap 30 sec x 3  Hip flexor stretch sitting 30 sec x 3  Row blue TB x 10  Shoulder extension blue TB x 10  Antirotation blue TB one strip x 10 each side   Manual Therapy: STM Lt iliopsoas musculature pt in supine LE's on bolster Lumbar mobs Grade II/III  PA mobs Lt GT Grade II/III; IR/ER LE hip ext, knee flexed  Kinesotaping - 2 strips lumbar spine to maintain neutral spine   Trigger Point Dry-Needling  Patient Consent Given: Yes Education handout provided: previously provided Muscles treated: QL; piriformis; glut max, glut medius; glut min  Electrical  stimulation performed: Yes Parameters: mAmp current to pt tolerance  Treatment response/outcome: decreased palpable tightness    Modalities:    Moist heat lumbar/hip and psoas x 10 min   04/18/22: Therapeutic Exercise:   Treadmill 1.2 to 2.0 x 6 min for warmup Transverse abdominals 10 sec x 10 reps  Nerve glide supine x 10  Hamstring stretch supine with strap 30 sec x 3  Piriformis stretch 30 sec x 3 Lt supine travell  Diagonal knee to chest 30 sec x 3 Quad stretch prone with strap 30 sec x 3  Hip flexor stretch sitting 30 sec x 3  Row blue TB x 10  Shoulder extension blue TB x 10  Antirotation blue TB one strip x 10 each side   Manual Therapy: STM Lt iliopsoas musculature pt in supine LE's on bolster Lumbar mobs Grade II/III  PA mobs Lt GT Grade II/III; IR/ER LE hip ext, knee flexed   Trigger Point Dry-Needling  Patient Consent Given: Yes Education handout provided: previously provided Muscles treated: QL; piriformis; glut max, glut medius; glut min  Electrical stimulation performed: Yes Parameters: mAmp current to pt tolerance  Treatment response/outcome: decreased palpable tightness    Modalities:    Moist heat lumbar/hip and psoas x 10 min    PATIENT EDUCATION:  Education details: dry needling handout, HEP Person educated: Patient Education method: Explanation, Demonstration, and Handouts Education comprehension: verbalized understanding, returned demonstration, and needs further education  HOME EXERCISE PROGRAM: Access Code: [email or web address] URL: [email or web address] Date: 04/13/2022 Prepared by: Corlis Leak  Exercises - Supine Transversus Abdominis Bracing with Pelvic Floor Contraction  - 2 x daily - 7 x weekly - 1 sets - 10 reps - 10sec  hold - Supine Sciatic Nerve Glide  - 2 x daily - 7 x weekly - 1 sets - 10 reps - Hooklying Hamstring Stretch with Strap  - 2 x daily - 7 x weekly - 1 sets - 3 reps - 30 sec  hold - Supine Piriformis Stretch with Leg  Straight  - 2 x daily - 7 x weekly - 1 sets - 3 reps - 30 sec  hold - Prone Quadriceps Stretch with Strap  - 2 x daily - 7 x weekly - 1 sets - 3 reps - 30 sec  hold - Seated Hip Flexor Stretch  - 1 x daily - 7 x weekly - 2 sets - 30 sec hold - Standing Bilateral Low Shoulder Row with Anchored Resistance  - 2 x daily - 7 x weekly - 1-3 sets - 10 reps - 2-3 sec  hold - Shoulder extension with resistance - Neutral  - 1 x daily - 7 x weekly - 1-2 sets - 10 reps - 3-5 sec  hold - Anti-Rotation Lateral Stepping with Press  - 2 x daily - 7 x weekly - 1-2 sets - 10 reps - 2-3 sec  hold  ASSESSMENT:  CLINICAL IMPRESSION: Flare up of symptoms after coughing Monday night. He has increased LBP and pain in the posterior hip to anterior Lt thigh. Increased pain and radicular symptoms. Good response to DN with decrease in pain for and increased tolerance for functional activities including longer time at work. Note continued tightness through the Rt QL, lumbar paraspinals; piriformis; gluts; psoas. Plan to continue to progress strengthening to tolerance and use modalities as needed to reduce irritability.  OBJECTIVE IMPAIRMENTS: decreased mobility, decreased ROM, decreased strength, hypomobility, increased fascial restrictions, impaired flexibility, impaired sensation, and pain.   GOALS: Goals reviewed with patient? Yes  SHORT TERM GOALS: Target date: 04/29/2022  Independent with initial HEP Goal status: IN PROGRESS  2.  The patient will report pain at rest < or equal to 3/10. Baseline:7/10 Goal status: IN PROGRESS   LONG TERM GOALS: Target date: 05/27/2022  Independent with final HEP Goal status:IN PROGRESS  2.  The patient will be able to return to full day of work. Baseline: leaves after 4 hours Goal status: IN PROGRESS  3. The patient will improve A/ROM lumbar flexion to 75% full range. Baseline: 50% with pain Goal status: IN PROGRESS  4.  The patient will report no pain at  rest. Baseline:7/10 Goal status: IN PROGRESS  PLAN:  PT FREQUENCY: 2x/week  PT DURATION: 8 weeks  PLANNED INTERVENTIONS: Therapeutic exercises, Therapeutic activity, Neuromuscular re-education, Balance training, Gait training, Patient/Family education, Self Care, Joint mobilization, Dry Needling, Electrical stimulation, Spinal mobilization, Moist heat, Traction, and Manual therapy.  PLAN FOR NEXT SESSION: Continue HEP progression, STM/DN with DN, stretching and strengthening to tolerance; core stabilization; lifting   Folashade Gamboa Rober Minion, PT, MPH  04/21/2022, 8:53 AM

## 2022-04-25 ENCOUNTER — Encounter: Payer: Self-pay | Admitting: Rehabilitative and Restorative Service Providers"

## 2022-04-25 ENCOUNTER — Ambulatory Visit: Payer: BC Managed Care – PPO | Admitting: Rehabilitative and Restorative Service Providers"

## 2022-04-25 DIAGNOSIS — R29898 Other symptoms and signs involving the musculoskeletal system: Secondary | ICD-10-CM | POA: Diagnosis not present

## 2022-04-25 DIAGNOSIS — M5459 Other low back pain: Secondary | ICD-10-CM | POA: Diagnosis not present

## 2022-04-25 NOTE — Therapy (Signed)
OUTPATIENT PHYSICAL THERAPY TREATMENT   Patient Name: Kalik Hoare MRN: [id number] DOB:15-Feb-1956, 66 y.o., male Today's Date: 04/25/2022   PT End of Session - 04/25/22 0848     Visit Number 9    Number of Visits 16    Date for PT Re-Evaluation 05/29/22    Authorization Type [employer] 30 visits/ year    PT Start Time 0846    PT Stop Time 0935    PT Time Calculation (min) 49 min    Activity Tolerance Patient tolerated treatment well               Past Medical History:  Diagnosis Date   Arthritis of both knees 09/26/2013   Patellofemoral chondromalacia and medial joint line arthritis of the right knee. Posttraumatic arthritis of the left knee    Diabetes mellitus without complication (HCC)    HLD (hyperlipidemia)    HTN (hypertension)    Past Surgical History:  Procedure Laterality Date   COLONOSCOPY     KNEE SURGERY Left    1980 &1992   Patient Active Problem List   Diagnosis Date Noted   Arthritis of both knees 09/26/2013    PCP: Valetta Close, MD REFERRING PROVIDER: Ceasar Lund, MD REFERRING DIAG: Lumbar Radiculopathy Rationale for Evaluation and Treatment: Rehabilitation  THERAPY DIAG:  Other low back pain  Other symptoms and signs involving the musculoskeletal system  ONSET DATE: 03/19/22  SUBJECTIVE:                                                                                                                                                                                           SUBJECTIVE STATEMENT: Patient reports that he felt better by the afternoon of last treatment and had a good weekend. Still has some tightness and pain in the morning and will awaken at night due to pain sometimes. Could tell the tape helped as a postural reminder. Improving symptoms.   PERTINENT HISTORY:  L TKR-- no lateral sensation at knee and lateral lower leg, HTN, prediabetic, HLD  PAIN:  Are you having pain? Yes: NPRS scale: 2/10 Pain location: L glut med region  to anterior thigh Pain description: achiness, squeezing sensation in thigh Aggravating factors: sitting Relieving factors: standing, walking, laying on right side Pain is relieved with heating pad.   PRECAUTIONS: None  WEIGHT BEARING RESTRICTIONS: No  FALLS:  Has patient fallen in last 6 months? No  PATIENT GOALS: reduce pain  OBJECTIVE:   PALPATION: Quadratus lumborum L tender to palpation, L glut med and L piriformis painful to palpation  LUMBAR ROM:   AROM eval  Flexion 50% limited Most painful  Extension 50% limitation  Right lateral flexion 50% limitation  Left lateral flexion 50% limitation  Right rotation 50% limitation Pain referred into groin  Left rotation 50% limitation   (Blank rows = not tested)  LOWER EXTREMITY MMT:    MMT Right eval Left eval  Hip flexion 5/5 5/5  Knee flexion 5/5 5/5  Knee extension 5/5 5/5  Ankle dorsiflexion 5/5 5/5  Ankle plantarflexion 5/5 4/5   (Blank rows = not tested)  LUMBAR SPECIAL TESTS:  Straight leg raise test: Produces pain in L thigh with R SLR Thomas test: produces pain in L posterior hip   [facility] Adult PT Treatment:                                                DATE:  04/25/22: Treadmill 2.4 x 6 min for warmup Transverse abdominals 10 sec x 10 reps  Nerve glide supine x 10  Hamstring stretch supine with strap 30 sec x 3  Piriformis stretch 30 sec x 3 Lt supine travell  Diagonal knee to chest 30 sec x 3 Hip flexor stretch sitting 30 sec x 3  Row blue TB x 10  Shoulder extension blue TB x 10  Antirotation blue TB one strip x 10 each side  Sit to stand hinged hip core tight x 10   Manual Therapy: STM Lt iliopsoas musculature pt in supine LE's on bolster Lumbar mobs Grade II/III  PA mobs Lt GT Grade II/III  Kinesotaping - 2 strips lumbar spine to maintain neutral spine   Trigger Point Dry-Needling  Patient Consent Given: Yes Education handout provided: previously provided Muscles treated: Lt lumbar  paraspinals; piriformis; glut max, glut medius; glut min  Electrical stimulation performed: Yes Parameters: mAmp current to pt tolerance  Treatment response/outcome: decreased palpable tightness    Modalities:    Moist heat lumbar/hip and psoas x 10 min  04/21/22: Treadmill 1.2 to 2.0 x 4 min for warmup Transverse abdominals 10 sec x 10 reps  Nerve glide supine x 10  Hamstring stretch supine with strap 30 sec x 3  Piriformis stretch 30 sec x 3 Lt supine travell  Diagonal knee to chest 30 sec x 3 Hold today: Quad stretch prone with strap 30 sec x 3  Hip flexor stretch sitting 30 sec x 3  Row blue TB x 10  Shoulder extension blue TB x 10  Antirotation blue TB one strip x 10 each side   Manual Therapy: STM Lt iliopsoas musculature pt in supine LE's on bolster Lumbar mobs Grade II/III  PA mobs Lt GT Grade II/III; IR/ER LE hip ext, knee flexed  Kinesotaping - 2 strips lumbar spine to maintain neutral spine   Trigger Point Dry-Needling  Patient Consent Given: Yes Education handout provided: previously provided Muscles treated: QL; piriformis; glut max, glut medius; glut min  Electrical stimulation performed: Yes Parameters: mAmp current to pt tolerance  Treatment response/outcome: decreased palpable tightness    Modalities:    Moist heat lumbar/hip and psoas x 10 min   PATIENT EDUCATION:  Education details: dry needling handout, HEP Person educated: Patient Education method: Explanation, Demonstration, and Handouts Education comprehension: verbalized understanding, returned demonstration, and needs further education  HOME EXERCISE PROGRAM: Access Code: [email or web address] URL: [email or web address] Date: 04/25/2022 Prepared by: Corlis Leak  Exercises - Supine Transversus Abdominis Bracing with Pelvic Floor Contraction  -  2 x daily - 7 x weekly - 1 sets - 10 reps - 10sec  hold - Supine Sciatic Nerve Glide  - 2 x daily - 7 x weekly - 1 sets - 10 reps - Hooklying Hamstring  Stretch with Strap  - 2 x daily - 7 x weekly - 1 sets - 3 reps - 30 sec  hold - Supine Piriformis Stretch with Leg Straight  - 2 x daily - 7 x weekly - 1 sets - 3 reps - 30 sec  hold - Prone Quadriceps Stretch with Strap  - 2 x daily - 7 x weekly - 1 sets - 3 reps - 30 sec  hold - Seated Hip Flexor Stretch  - 1 x daily - 7 x weekly - 2 sets - 30 sec hold - Standing Bilateral Low Shoulder Row with Anchored Resistance  - 2 x daily - 7 x weekly - 1-3 sets - 10 reps - 2-3 sec  hold - Shoulder extension with resistance - Neutral  - 1 x daily - 7 x weekly - 1-2 sets - 10 reps - 3-5 sec  hold - Anti-Rotation Lateral Stepping with Press  - 2 x daily - 7 x weekly - 1-2 sets - 10 reps - 2-3 sec  hold - Sit to Stand  - 2 x daily - 7 x weekly - 1 sets - 10 reps - 3-5 sec  hold - Wall Quarter Squat  - 2 x daily - 7 x weekly - 1-2 sets - 10 reps - 5-10 sec  hold  ASSESSMENT:  CLINICAL IMPRESSION: Good response to DN and manual work followed by exercises last visit. Resolution of flare up by that afternoon and has had a good weekend. Continued treatment including DN and manual work. Added core stailization exercises for home. Symptoms are still increased with  coughing. Note continued tightness through the Rt QL, lumbar paraspinals; piriformis; gluts; psoas. Continue to progress strengthening as tolerance; modalities as indicated to decrease pain and tissue irritability.  OBJECTIVE IMPAIRMENTS: decreased mobility, decreased ROM, decreased strength, hypomobility, increased fascial restrictions, impaired flexibility, impaired sensation, and pain.   GOALS: Goals reviewed with patient? Yes  SHORT TERM GOALS: Target date: 04/29/2022  Independent with initial HEP Goal status: IN PROGRESS  2.  The patient will report pain at rest < or equal to 3/10. Baseline:7/10 Goal status: IN PROGRESS   LONG TERM GOALS: Target date: 05/27/2022  Independent with final HEP Goal status:IN PROGRESS  2.  The patient will  be able to return to full day of work. Baseline: leaves after 4 hours Goal status: IN PROGRESS  3. The patient will improve A/ROM lumbar flexion to 75% full range. Baseline: 50% with pain Goal status: IN PROGRESS  4.  The patient will report no pain at rest. Baseline:7/10 Goal status: IN PROGRESS  PLAN:  PT FREQUENCY: 2x/week  PT DURATION: 8 weeks  PLANNED INTERVENTIONS: Therapeutic exercises, Therapeutic activity, Neuromuscular re-education, Balance training, Gait training, Patient/Family education, Self Care, Joint mobilization, Dry Needling, Electrical stimulation, Spinal mobilization, Moist heat, Traction, and Manual therapy.  PLAN FOR NEXT SESSION: Continue HEP progression, STM/DN with DN, stretching and strengthening to tolerance; core stabilization; lifting   Sirius Woodford Rober Minion, PT, MPH  04/25/2022, 8:48 AM

## 2022-04-28 ENCOUNTER — Encounter: Payer: Self-pay | Admitting: Rehabilitative and Restorative Service Providers"

## 2022-04-28 ENCOUNTER — Ambulatory Visit: Payer: BC Managed Care – PPO | Admitting: Rehabilitative and Restorative Service Providers"

## 2022-04-28 DIAGNOSIS — M5459 Other low back pain: Secondary | ICD-10-CM | POA: Diagnosis not present

## 2022-04-28 DIAGNOSIS — R29898 Other symptoms and signs involving the musculoskeletal system: Secondary | ICD-10-CM | POA: Diagnosis not present

## 2022-04-28 NOTE — Therapy (Addendum)
OUTPATIENT PHYSICAL THERAPY TREATMENT   Patient Name: Ricardo Mccormick MRN: [id number] DOB:1956-01-08, 66 y.o., male Today's Date: 04/28/2022   PT End of Session - 04/28/22 0804     Visit Number 10    Number of Visits 16    Date for PT Re-Evaluation 05/29/22    Authorization Type [employer] 30 visits/ year    PT Start Time 0759    PT Stop Time 0847    PT Time Calculation (min) 48 min    Activity Tolerance Patient tolerated treatment well               Past Medical History:  Diagnosis Date   Arthritis of both knees 09/26/2013   Patellofemoral chondromalacia and medial joint line arthritis of the right knee. Posttraumatic arthritis of the left knee    Diabetes mellitus without complication ([place])    HLD (hyperlipidemia)    HTN (hypertension)    Past Surgical History:  Procedure Laterality Date   COLONOSCOPY     KNEE SURGERY Left    1980 &1992   Patient Active Problem List   Diagnosis Date Noted   Arthritis of both knees 09/26/2013    PCP: Diamantina Monks, MD REFERRING PROVIDER: Johnell Comings, MD REFERRING DIAG: Lumbar Radiculopathy Rationale for Evaluation and Treatment: Rehabilitation  THERAPY DIAG:  Other low back pain  Other symptoms and signs involving the musculoskeletal system  ONSET DATE: 03/19/22  SUBJECTIVE:                                                                                                                                                                                           SUBJECTIVE STATEMENT: Patient reports that he feels better at times but continues to have some flare ups of pain radiating into the Lt lateral to anterior thigh. Working on his exercises and feels that he is getting better overall. Less frequent symptoms and most of the time the symptoms are less severe. Slept the full night Tuesday night for the first time. Could tell the tape helped as a postural reminder.  PERTINENT HISTORY:  L TKR-- no lateral sensation at knee and  lateral lower leg, HTN, prediabetic, HLD  PAIN:  Are you having pain? Yes: NPRS scale: 2/10 Pain location: L glut med region to anterior thigh Pain description: achiness, squeezing sensation in thigh Aggravating factors: sitting Relieving factors: standing, walking, laying on right side Pain is relieved with heating pad.   PRECAUTIONS: None  WEIGHT BEARING RESTRICTIONS: No  FALLS:  Has patient fallen in last 6 months? No  PATIENT GOALS: reduce pain  OBJECTIVE:   PALPATION: Quadratus lumborum L tender to palpation, L  glut med and L piriformis painful to palpation  LUMBAR ROM:   AROM eval  Flexion 50% limited Most painful  Extension 50% limitation  Right lateral flexion 50% limitation  Left lateral flexion 50% limitation  Right rotation 50% limitation Pain referred into groin  Left rotation 50% limitation   (Blank rows = not tested)  LOWER EXTREMITY MMT:    MMT Right eval Left eval  Hip flexion 5/5 5/5  Knee flexion 5/5 5/5  Knee extension 5/5 5/5  Ankle dorsiflexion 5/5 5/5  Ankle plantarflexion 5/5 4/5   (Blank rows = not tested)  LUMBAR SPECIAL TESTS:  Straight leg raise test: Produces pain in L thigh with R SLR Thomas test: produces pain in L posterior hip   [facility] Adult PT Treatment:                                                DATE:  04/28/22: Treadmill 2.4 x 6 min for warmup Transverse abdominals 10 sec x 10 reps  Nerve glide supine x 10  Hamstring stretch supine with strap 30 sec x 3  Piriformis stretch 30 sec x 3 Lt supine travell  Diagonal knee to chest 30 sec x 3 Antirotation blue TB one strip x 10 each side  Sit to stand hinged hip core tight x 10  Hip flexor stretch supine Thomas stretch   Manual Therapy: STM Lt iliopsoas musculature pt in supine LE's on bolster Lumbar mobs Grade II/III  PA mobs Lt GT Grade II/III  Kinesotaping - 2 strips lumbar spine in X pattern to help maintain neutral spine   Trigger Point Dry-Needling  Patient  Consent Given: Yes Education handout provided: previously provided Muscles treated: Lt iliopsoas; Lt proximal lateral thigh - quads; glut min/max/piriformis Electrical stimulation performed: Yes Parameters: mAmp current to pt tolerance  Treatment response/outcome: decreased palpable tightness    Modalities:    Moist heat lumbar/hip and psoas x 10 min   04/25/22: Treadmill 2.4 x 6 min for warmup Transverse abdominals 10 sec x 10 reps  Nerve glide supine x 10  Hamstring stretch supine with strap 30 sec x 3  Piriformis stretch 30 sec x 3 Lt supine travell  Diagonal knee to chest 30 sec x 3 Hip flexor stretch sitting 30 sec x 3  Row blue TB x 10  Shoulder extension blue TB x 10  Antirotation blue TB one strip x 10 each side  Sit to stand hinged hip core tight x 10   Manual Therapy: STM Lt iliopsoas musculature pt in supine LE's on bolster Lumbar mobs Grade II/III  PA mobs Lt GT Grade II/III  Kinesotaping - 2 strips lumbar spine to maintain neutral spine   Trigger Point Dry-Needling  Patient Consent Given: Yes Education handout provided: previously provided Muscles treated: Lt lumbar paraspinals; piriformis; glut max, glut medius; glut min  Electrical stimulation performed: Yes Parameters: mAmp current to pt tolerance  Treatment response/outcome: decreased palpable tightness    Modalities:    Moist heat lumbar/hip and psoas x 10 min    PATIENT EDUCATION:  Education details: dry needling handout, HEP Person educated: Patient Education method: Explanation, Demonstration, and Handouts Education comprehension: verbalized understanding, returned demonstration, and needs further education  HOME EXERCISE PROGRAM:  Access Code: [email or web address] URL: [email or web address] Date: 04/28/2022 Prepared by: Gillermo Murdoch  Exercises - Supine Transversus Abdominis Bracing with  Pelvic Floor Contraction  - 2 x daily - 7 x weekly - 1 sets - 10 reps - 10sec  hold - Supine Sciatic  Nerve Glide  - 2 x daily - 7 x weekly - 1 sets - 10 reps - Hooklying Hamstring Stretch with Strap  - 2 x daily - 7 x weekly - 1 sets - 3 reps - 30 sec  hold - Supine Piriformis Stretch with Leg Straight  - 2 x daily - 7 x weekly - 1 sets - 3 reps - 30 sec  hold - Prone Quadriceps Stretch with Strap  - 2 x daily - 7 x weekly - 1 sets - 3 reps - 30 sec  hold - Seated Hip Flexor Stretch  - 1 x daily - 7 x weekly - 2 sets - 30 sec hold - Standing Bilateral Low Shoulder Row with Anchored Resistance  - 2 x daily - 7 x weekly - 1-3 sets - 10 reps - 2-3 sec  hold - Shoulder extension with resistance - Neutral  - 1 x daily - 7 x weekly - 1-2 sets - 10 reps - 3-5 sec  hold - Anti-Rotation Lateral Stepping with Press  - 2 x daily - 7 x weekly - 1-2 sets - 10 reps - 2-3 sec  hold - Sit to Stand  - 2 x daily - 7 x weekly - 1 sets - 10 reps - 3-5 sec  hold - Wall Quarter Squat  - 2 x daily - 7 x weekly - 1-2 sets - 10 reps - 5-10 sec  hold - Hip Flexor Stretch at Edge of Bed  - 2 x daily - 7 x weekly - 1 sets - 3 reps - 30 sec  hold ASSESSMENT:  CLINICAL IMPRESSION: Trial of DN to the Lt iliopsoas and lateral thigh. Good response to DN and manual work followed by exercises last visit. Continued intermittent symptoms including radicular symptoms but they are less frequent and less intense. Continued core stailization exercises for home. Continue to progress strengthening as tolerance; modalities as indicated to decrease pain and tissue irritability. Issued heel lift for trial at home due to visible leg length difference in standing.   OBJECTIVE IMPAIRMENTS: decreased mobility, decreased ROM, decreased strength, hypomobility, increased fascial restrictions, impaired flexibility, impaired sensation, and pain.   GOALS: Goals reviewed with patient? Yes  SHORT TERM GOALS: Target date: 04/29/2022  Independent with initial HEP Goal status: IN PROGRESS  2.  The patient will report pain at rest < or equal to  3/10. Baseline:7/10 Goal status: IN PROGRESS   LONG TERM GOALS: Target date: 05/27/2022  Independent with final HEP Goal status:IN PROGRESS  2.  The patient will be able to return to full day of work. Baseline: leaves after 4 hours Goal status: IN PROGRESS  3. The patient will improve A/ROM lumbar flexion to 75% full range. Baseline: 50% with pain Goal status: IN PROGRESS  4.  The patient will report no pain at rest. Baseline:7/10 Goal status: IN PROGRESS  PLAN:  PT FREQUENCY: 2x/week  PT DURATION: 8 weeks  PLANNED INTERVENTIONS: Therapeutic exercises, Therapeutic activity, Neuromuscular re-education, Balance training, Gait training, Patient/Family education, Self Care, Joint mobilization, Dry Needling, Electrical stimulation, Spinal mobilization, Moist heat, Traction, and Manual therapy.  PLAN FOR NEXT SESSION: Continue HEP progression, STM/DN with DN, stretching and strengthening to tolerance; core stabilization; lifting   Righteous Claiborne Rober Minion, PT, MPH  04/28/2022, 12:29 PM

## 2022-05-02 ENCOUNTER — Encounter: Payer: Self-pay | Admitting: Rehabilitative and Restorative Service Providers"

## 2022-05-02 ENCOUNTER — Ambulatory Visit: Payer: BC Managed Care – PPO | Admitting: Rehabilitative and Restorative Service Providers"

## 2022-05-02 DIAGNOSIS — M5459 Other low back pain: Secondary | ICD-10-CM

## 2022-05-02 DIAGNOSIS — R29898 Other symptoms and signs involving the musculoskeletal system: Secondary | ICD-10-CM

## 2022-05-02 NOTE — Therapy (Signed)
OUTPATIENT PHYSICAL THERAPY TREATMENT   Patient Name: Ricardo Mccormick MRN: [id number] DOB:1956-02-08, 66 y.o., male Today's Date: 05/02/2022   PT End of Session - 05/02/22 0804     Visit Number 11    Number of Visits 16    Date for PT Re-Evaluation 05/29/22    Authorization Type [employer] 30 visits/ year    PT Start Time 0800    PT Stop Time 0850    PT Time Calculation (min) 50 min               Past Medical History:  Diagnosis Date   Arthritis of both knees 09/26/2013   Patellofemoral chondromalacia and medial joint line arthritis of the right knee. Posttraumatic arthritis of the left knee    Diabetes mellitus without complication (HCC)    HLD (hyperlipidemia)    HTN (hypertension)    Past Surgical History:  Procedure Laterality Date   COLONOSCOPY     KNEE SURGERY Left    1980 &1992   Patient Active Problem List   Diagnosis Date Noted   Arthritis of both knees 09/26/2013    PCP: Valetta Close, MD REFERRING PROVIDER: Ceasar Lund, MD REFERRING DIAG: Lumbar Radiculopathy Rationale for Evaluation and Treatment: Rehabilitation  THERAPY DIAG:  Other low back pain  Other symptoms and signs involving the musculoskeletal system  ONSET DATE: 03/19/22  SUBJECTIVE:                                                                                                                                                                                           SUBJECTIVE STATEMENT: Patient reports that he had a pretty good day Saturday but a not so good day yesterday. He feels better at times but continues to have flare ups of pain radiating into the Lt lateral hip to anterior thigh. Working on his exercises and feels that he is getting better overall. Less frequent symptoms and most of the time the symptoms are less severe. Sleeping better - only awakens to go to the bathroom. Tape still seems to help as a postural reminder. No problem with heel lift. Has been wearing it in his shoes  but not in these shoes today.   PERTINENT HISTORY:  L TKR-- no lateral sensation at knee and lateral lower leg, HTN, prediabetic, HLD  PAIN:  Are you having pain? Yes: NPRS scale: 3/10 Pain location: L glut med region to anterior thigh Pain description: achiness, squeezing sensation in thigh Aggravating factors: sitting Relieving factors: standing, walking, laying on right side Pain is relieved with heating pad.   PRECAUTIONS: None  WEIGHT BEARING RESTRICTIONS: No  FALLS:  Has  patient fallen in last 6 months? No  PATIENT GOALS: reduce pain  OBJECTIVE:   PALPATION: Quadratus lumborum L tender to palpation, L glut med and L piriformis painful to palpation  LUMBAR ROM:   AROM eval  Flexion 50% limited Most painful  Extension 50% limitation  Right lateral flexion 50% limitation  Left lateral flexion 50% limitation  Right rotation 50% limitation Pain referred into groin  Left rotation 50% limitation   (Blank rows = not tested)  LOWER EXTREMITY MMT:    MMT Right eval Left eval  Hip flexion 5/5 5/5  Knee flexion 5/5 5/5  Knee extension 5/5 5/5  Ankle dorsiflexion 5/5 5/5  Ankle plantarflexion 5/5 4/5   (Blank rows = not tested)  LUMBAR SPECIAL TESTS:  Straight leg raise test: Produces pain in L thigh with R SLR Thomas test: produces pain in L posterior hip   [facility] Adult PT Treatment:                                                DATE:  05/02/22: Treadmill 2.4 x 6 min for warmup Transverse abdominals 10 sec x 10 reps  Quad stretch 30 sec x 3 with strap  Nerve glide supine x 10  Hamstring stretch supine with strap 30 sec x 3  Piriformis stretch 30 sec x 3 Lt supine travell  Diagonal knee to chest 30 sec x 3 Antirotation blue TB one strip x 10 each side  Sit to stand hinged hip core tight x 10  Modified deadlift hinged hip from 8 in stool 15# KB x 10  Hip flexor stretch supine Thomas stretch  Manual Therapy: STM Lt iliopsoas musculature pt in supine LE's on  bolster Lumbar mobs Grade II/III  PA mobs Lt GT Grade II/III  Kinesotaping - 2 strips lumbar spine bilat paraspinals help maintain neutral spine   Trigger Point Dry-Needling  Patient Consent Given: Yes Education handout provided: previously provided Muscles treated: Lt QL; glut min/max/piriformis Electrical stimulation performed: Yes Parameters: mAmp current to pt tolerance  Treatment response/outcome: decreased palpable tightness    Modalities:    Moist heat lumbar/hip and psoas x 10 min   04/28/22: Treadmill 2.4 x 6 min for warmup Transverse abdominals 10 sec x 10 reps  Nerve glide supine x 10  Hamstring stretch supine with strap 30 sec x 3  Piriformis stretch 30 sec x 3 Lt supine travell  Diagonal knee to chest 30 sec x 3 Antirotation blue TB one strip x 10 each side  Sit to stand hinged hip core tight x 10  Hip flexor stretch supine Thomas stretch   Manual Therapy: STM Lt iliopsoas musculature pt in supine LE's on bolster Lumbar mobs Grade II/III  PA mobs Lt GT Grade II/III  Kinesotaping - 2 strips lumbar spine in X pattern to help maintain neutral spine   Trigger Point Dry-Needling  Patient Consent Given: Yes Education handout provided: previously provided Muscles treated: Lt iliopsoas; Lt proximal lateral thigh - quads; glut min/max/piriformis Electrical stimulation performed: Yes Parameters: mAmp current to pt tolerance  Treatment response/outcome: decreased palpable tightness    Modalities:    Moist heat lumbar/hip and psoas x 10 min    PATIENT EDUCATION:  Education details: dry needling handout, HEP Person educated: Patient Education method: Explanation, Demonstration, and Handouts Education comprehension: verbalized understanding, returned demonstration, and needs further  education  HOME EXERCISE PROGRAM:  Access Code: [email or web address] URL: [email or web address] Date: 04/28/2022 Prepared by: Corlis Leakelyn Phillippe Orlick  Exercises - Supine Transversus  Abdominis Bracing with Pelvic Floor Contraction  - 2 x daily - 7 x weekly - 1 sets - 10 reps - 10sec  hold - Supine Sciatic Nerve Glide  - 2 x daily - 7 x weekly - 1 sets - 10 reps - Hooklying Hamstring Stretch with Strap  - 2 x daily - 7 x weekly - 1 sets - 3 reps - 30 sec  hold - Supine Piriformis Stretch with Leg Straight  - 2 x daily - 7 x weekly - 1 sets - 3 reps - 30 sec  hold - Prone Quadriceps Stretch with Strap  - 2 x daily - 7 x weekly - 1 sets - 3 reps - 30 sec  hold - Seated Hip Flexor Stretch  - 1 x daily - 7 x weekly - 2 sets - 30 sec hold - Standing Bilateral Low Shoulder Row with Anchored Resistance  - 2 x daily - 7 x weekly - 1-3 sets - 10 reps - 2-3 sec  hold - Shoulder extension with resistance - Neutral  - 1 x daily - 7 x weekly - 1-2 sets - 10 reps - 3-5 sec  hold - Anti-Rotation Lateral Stepping with Press  - 2 x daily - 7 x weekly - 1-2 sets - 10 reps - 2-3 sec  hold - Sit to Stand  - 2 x daily - 7 x weekly - 1 sets - 10 reps - 3-5 sec  hold - Wall Quarter Squat  - 2 x daily - 7 x weekly - 1-2 sets - 10 reps - 5-10 sec  hold - Hip Flexor Stretch at Edge of Bed  - 2 x daily - 7 x weekly - 1 sets - 3 reps - 30 sec  hold ASSESSMENT:  CLINICAL IMPRESSION: Continued DN to QL, lumbar paraspinals, glut min/men/max. piriformis. Good response to DN and manual work followed by exercises. Continued intermittent symptoms including radicular symptoms but they are less frequent and less intense. Continued core stailization exercises for home. Progress strengthening as tolerance; modalities as indicated to decrease pain and tissue irritability.   OBJECTIVE IMPAIRMENTS: decreased mobility, decreased ROM, decreased strength, hypomobility, increased fascial restrictions, impaired flexibility, impaired sensation, and pain.   GOALS: Goals reviewed with patient? Yes  SHORT TERM GOALS: Target date: 04/29/2022  Independent with initial HEP Goal status: IN PROGRESS  2.  The patient will  report pain at rest < or equal to 3/10. Baseline:7/10 Goal status: IN PROGRESS   LONG TERM GOALS: Target date: 05/27/2022  Independent with final HEP Goal status:IN PROGRESS  2.  The patient will be able to return to full day of work. Baseline: leaves after 4 hours Goal status: IN PROGRESS  3. The patient will improve A/ROM lumbar flexion to 75% full range. Baseline: 50% with pain Goal status: IN PROGRESS  4.  The patient will report no pain at rest. Baseline:7/10 Goal status: IN PROGRESS  PLAN:  PT FREQUENCY: 2x/week  PT DURATION: 8 weeks  PLANNED INTERVENTIONS: Therapeutic exercises, Therapeutic activity, Neuromuscular re-education, Balance training, Gait training, Patient/Family education, Self Care, Joint mobilization, Dry Needling, Electrical stimulation, Spinal mobilization, Moist heat, Traction, and Manual therapy.  PLAN FOR NEXT SESSION: Continue HEP progression, STM/DN with DN, stretching and strengthening to tolerance; core stabilization; lifting - assess hp height in standing with heel lift   Margaret Cockerill Rober MinionP Tressy Kunzman, PT,  MPH  05/02/2022, 9:12 AM

## 2022-05-05 ENCOUNTER — Ambulatory Visit: Payer: BC Managed Care – PPO | Admitting: Rehabilitative and Restorative Service Providers"

## 2022-05-05 DIAGNOSIS — R29898 Other symptoms and signs involving the musculoskeletal system: Secondary | ICD-10-CM | POA: Diagnosis not present

## 2022-05-05 DIAGNOSIS — M5459 Other low back pain: Secondary | ICD-10-CM

## 2022-05-05 NOTE — Therapy (Signed)
OUTPATIENT PHYSICAL THERAPY TREATMENT   Patient Name: Ricardo Mccormick MRN: [id number] DOB:17-Feb-1956, 66 y.o., male Today's Date: 05/05/2022   PT End of Session - 05/05/22 0813     Visit Number 12    Number of Visits 16    Date for PT Re-Evaluation 05/29/22    Authorization Type [employer] 30 visits/ year    PT Start Time 0800    PT Stop Time 0850    PT Time Calculation (min) 50 min               Past Medical History:  Diagnosis Date   Arthritis of both knees 09/26/2013   Patellofemoral chondromalacia and medial joint line arthritis of the right knee. Posttraumatic arthritis of the left knee    Diabetes mellitus without complication (HCC)    HLD (hyperlipidemia)    HTN (hypertension)    Past Surgical History:  Procedure Laterality Date   COLONOSCOPY     KNEE SURGERY Left    1980 &1992   Patient Active Problem List   Diagnosis Date Noted   Arthritis of both knees 09/26/2013    PCP: Valetta Close, MD REFERRING PROVIDER: Ceasar Lund, MD REFERRING DIAG: Lumbar Radiculopathy Rationale for Evaluation and Treatment: Rehabilitation  THERAPY DIAG:  Other low back pain  Other symptoms and signs involving the musculoskeletal system  ONSET DATE: 03/19/22  SUBJECTIVE:                                                                                                                                                                                           SUBJECTIVE STATEMENT: Patient reports that he had a pretty good day yesterday. He feels better at times but continues to have flare ups of pain radiating into the Lt lateral hip to anterior thigh but radiating symptoms are less frequent. Working on his exercises and feels that he is gradually improving. Less frequent symptoms and most of the time the symptoms are less severe. Sleeping better - only awakens to go to the bathroom. Tape still seems to help as a postural reminder. No problem with heel lift. Has been wearing it in  his shoes but not in these shoes today.   PERTINENT HISTORY:  L TKR-- no lateral sensation at knee and lateral lower leg, HTN, prediabetic, HLD  PAIN:  Are you having pain? Yes: NPRS scale: 3-4/10 Pain location: L glut med region to anterior thigh Pain description: achiness, squeezing sensation in thigh Aggravating factors: sitting Relieving factors: standing, walking, laying on right side Pain is relieved with heating pad.   PRECAUTIONS: None  WEIGHT BEARING RESTRICTIONS: No  FALLS:  Has patient fallen  in last 6 months? No  PATIENT GOALS: reduce pain  OBJECTIVE:   PALPATION: Quadratus lumborum L tender to palpation, L glut med and L piriformis painful to palpation  LUMBAR ROM:   AROM eval  Flexion 50% limited Most painful  Extension 50% limitation  Right lateral flexion 50% limitation  Left lateral flexion 50% limitation  Right rotation 50% limitation Pain referred into groin  Left rotation 50% limitation   (Blank rows = not tested)  LOWER EXTREMITY MMT:    MMT Right eval Left eval  Hip flexion 5/5 5/5  Knee flexion 5/5 5/5  Knee extension 5/5 5/5  Ankle dorsiflexion 5/5 5/5  Ankle plantarflexion 5/5 4/5   (Blank rows = not tested)  LUMBAR SPECIAL TESTS:  Straight leg raise test: Produces pain in L thigh with R SLR Thomas test: produces pain in L posterior hip   [facility] Adult PT Treatment:                                                DATE:  05/05/22:  Anterior/posterior tilt without pain limits and radicular qymptoms on dynadisc x 10 x 2  Lumbar extension with stretch out strap sucured at door through partial range x 5 x 2  Transverse abdominals 10 sec x 10 reps  Quad stretch 30 sec x 3 with strap  Nerve glide supine x 10  Hamstring stretch supine with strap 30 sec x 3  Piriformis stretch 30 sec x 3 Lt supine travell  Diagonal knee to chest 30 sec x 3 Antirotation blue TB one strip x 10 each side  Sit to stand hinged hip core tight x 10  Hip flexor  stretch supine Thomas stretch  Manual Therapy: STM Lt lumbar and posterior hip Lumbar mobs Grade II/III  PA mobs Lt GT Grade II/III  Kinesotaping - 2 strips lumbar spine bilat paraspinals help maintain neutral spine   Trigger Point Dry-Needling  Patient Consent Given: Yes Education handout provided: previously provided Muscles treated: Lt QL; glut min/max/piriformis Electrical stimulation performed: Yes Parameters: mAmp current to pt tolerance  Treatment response/outcome: decreased palpable tightness    Modalities:    Moist heat lumbar/hip and psoas x 10 min   05/02/22: Treadmill 2.4 x 6 min for warmup Transverse abdominals 10 sec x 10 reps  Quad stretch 30 sec x 3 with strap  Nerve glide supine x 10  Hamstring stretch supine with strap 30 sec x 3  Piriformis stretch 30 sec x 3 Lt supine travell  Diagonal knee to chest 30 sec x 3 Antirotation blue TB one strip x 10 each side  Sit to stand hinged hip core tight x 10  Modified deadlift hinged hip from 8 in stool 15# KB x 10  Hip flexor stretch supine Thomas stretch  Manual Therapy: STM Lt iliopsoas musculature pt in supine LE's on bolster Lumbar mobs Grade II/III  PA mobs Lt GT Grade II/III  Kinesotaping - 2 strips lumbar spine bilat paraspinals help maintain neutral spine   Trigger Point Dry-Needling  Patient Consent Given: Yes Education handout provided: previously provided Muscles treated: Lt QL; glut min/max/piriformis Electrical stimulation performed: Yes Parameters: mAmp current to pt tolerance  Treatment response/outcome: decreased palpable tightness    Modalities:    Moist heat lumbar/hip and psoas x 10 min   PATIENT EDUCATION:  Education details: dry needling handout,  HEP Person educated: Patient Education method: Explanation, Demonstration, and Handouts Education comprehension: verbalized understanding, returned demonstration, and needs further education  HOME EXERCISE PROGRAM:  Access Code:  [email or web address] URL: [email or web address] Date: 04/28/2022 Prepared by: Corlis Leak  Exercises - Supine Transversus Abdominis Bracing with Pelvic Floor Contraction  - 2 x daily - 7 x weekly - 1 sets - 10 reps - 10sec  hold - Supine Sciatic Nerve Glide  - 2 x daily - 7 x weekly - 1 sets - 10 reps - Hooklying Hamstring Stretch with Strap  - 2 x daily - 7 x weekly - 1 sets - 3 reps - 30 sec  hold - Supine Piriformis Stretch with Leg Straight  - 2 x daily - 7 x weekly - 1 sets - 3 reps - 30 sec  hold - Prone Quadriceps Stretch with Strap  - 2 x daily - 7 x weekly - 1 sets - 3 reps - 30 sec  hold - Seated Hip Flexor Stretch  - 1 x daily - 7 x weekly - 2 sets - 30 sec hold - Standing Bilateral Low Shoulder Row with Anchored Resistance  - 2 x daily - 7 x weekly - 1-3 sets - 10 reps - 2-3 sec  hold - Shoulder extension with resistance - Neutral  - 1 x daily - 7 x weekly - 1-2 sets - 10 reps - 3-5 sec  hold - Anti-Rotation Lateral Stepping with Press  - 2 x daily - 7 x weekly - 1-2 sets - 10 reps - 2-3 sec  hold - Sit to Stand  - 2 x daily - 7 x weekly - 1 sets - 10 reps - 3-5 sec  hold - Wall Quarter Squat  - 2 x daily - 7 x weekly - 1-2 sets - 10 reps - 5-10 sec  hold - Hip Flexor Stretch at Edge of Bed  - 2 x daily - 7 x weekly - 1 sets - 3 reps - 30 sec  hold ASSESSMENT:  CLINICAL IMPRESSION: Continued DN to glut min/med/max. piriformis. Good response to DN and manual work followed by exercises. Continued intermittent symptoms including radicular symptoms but they are less frequent and less intense. Continued core stailization exercises for home. Progress strengthening as tolerance; modalities as indicated to decrease pain and tissue irritability.   OBJECTIVE IMPAIRMENTS: decreased mobility, decreased ROM, decreased strength, hypomobility, increased fascial restrictions, impaired flexibility, impaired sensation, and pain.   GOALS: Goals reviewed with patient? Yes  SHORT TERM GOALS:  Target date: 04/29/2022  Independent with initial HEP Goal status: IN PROGRESS  2.  The patient will report pain at rest < or equal to 3/10. Baseline:7/10 Goal status: IN PROGRESS   LONG TERM GOALS: Target date: 05/27/2022  Independent with final HEP Goal status:IN PROGRESS  2.  The patient will be able to return to full day of work. Baseline: leaves after 4 hours Goal status: IN PROGRESS  3. The patient will improve A/ROM lumbar flexion to 75% full range. Baseline: 50% with pain Goal status: IN PROGRESS  4.  The patient will report no pain at rest. Baseline:7/10 Goal status: IN PROGRESS  PLAN:  PT FREQUENCY: 2x/week  PT DURATION: 8 weeks  PLANNED INTERVENTIONS: Therapeutic exercises, Therapeutic activity, Neuromuscular re-education, Balance training, Gait training, Patient/Family education, Self Care, Joint mobilization, Dry Needling, Electrical stimulation, Spinal mobilization, Moist heat, Traction, and Manual therapy.  PLAN FOR NEXT SESSION: Continue HEP progression, STM/DN with DN, stretching and strengthening to tolerance; core stabilization;  lifting - assess hp height in standing with heel lift   Detta Mellin Rober Minion, PT, MPH  05/05/2022, 8:17 AM

## 2022-05-11 ENCOUNTER — Encounter: Payer: Self-pay | Admitting: Rehabilitative and Restorative Service Providers"

## 2022-05-11 ENCOUNTER — Ambulatory Visit: Payer: BC Managed Care – PPO | Admitting: Rehabilitative and Restorative Service Providers"

## 2022-05-11 DIAGNOSIS — M5459 Other low back pain: Secondary | ICD-10-CM | POA: Diagnosis not present

## 2022-05-11 DIAGNOSIS — R29898 Other symptoms and signs involving the musculoskeletal system: Secondary | ICD-10-CM

## 2022-05-11 NOTE — Therapy (Signed)
OUTPATIENT PHYSICAL THERAPY TREATMENT   Patient Name: Ricardo Mccormick MRN: [id number] DOB:[birth year], 66 y.o., male Today's Date: 05/11/2022   PT End of Session - 05/11/22 0759     Visit Number 13    Number of Visits 16    Date for PT Re-Evaluation 05/29/22    Authorization Type [employer] 30 visits/ year    PT Start Time 0805    PT Stop Time 0850    PT Time Calculation (min) 45 min    Activity Tolerance Patient tolerated treatment well    Behavior During Therapy [facility] for tasks assessed/performed               Past Medical History:  Diagnosis Date   Arthritis of both knees 09/26/2013   Patellofemoral chondromalacia and medial joint line arthritis of the right knee. Posttraumatic arthritis of the left knee    Diabetes mellitus without complication ([place])    HLD (hyperlipidemia)    HTN (hypertension)    Past Surgical History:  Procedure Laterality Date   COLONOSCOPY     KNEE SURGERY Left    1980 &1992   Patient Active Problem List   Diagnosis Date Noted   Arthritis of both knees 09/26/2013    PCP: Diamantina Monks, MD REFERRING PROVIDER: Johnell Comings, MD REFERRING DIAG: Lumbar Radiculopathy Rationale for Evaluation and Treatment: Rehabilitation  THERAPY DIAG:  Other low back pain  Other symptoms and signs involving the musculoskeletal system  ONSET DATE: 03/19/22  SUBJECTIVE:                                                                                                                                                                                           SUBJECTIVE STATEMENT: The patient reports symptoms are flared today. He has increased pain in L lumbar region radiating into L hip and anterior thigh. He is noting pain down into his ankle.   PERTINENT HISTORY:  L TKR-- no lateral sensation at knee and lateral lower leg, HTN, prediabetic, HLD  PAIN:  Are you having pain? Yes: NPRS scale: 6-7/10 Pain location: L glut med region to anterior thigh Pain  description: achiness, squeezing sensation in thigh Aggravating factors: sitting Relieving factors: standing, walking, laying on right side Pain is relieved with heating pad.   PRECAUTIONS: None  WEIGHT BEARING RESTRICTIONS: No  FALLS:  Has patient fallen in last 6 months? No  PATIENT GOALS: reduce pain  OBJECTIVE:   PALPATION: Quadratus lumborum L tender to palpation, L glut med and L piriformis painful to palpation  LUMBAR ROM:   AROM eval 05/11/22  Flexion 50% limited Most painful *Painful and limited  Extension 50%  limitation 50 % limitation with some pain today  Right lateral flexion 50% limitation   Left lateral flexion 50% limitation   Right rotation 50% limitation Pain referred into groin   Left rotation 50% limitation    (Blank rows = not tested)  LOWER EXTREMITY MMT:    MMT Right eval Left eval  Hip flexion 5/5 5/5  Knee flexion 5/5 5/5  Knee extension 5/5 5/5  Ankle dorsiflexion 5/5 5/5  Ankle plantarflexion 5/5 4/5   (Blank rows = not tested)  LUMBAR SPECIAL TESTS:  Straight leg raise test: Produces pain in L thigh with R SLR Thomas test: produces pain in L posterior hip   [facility] Adult PT Treatment:                                                DATE: 05/11/22 Therapeutic Exercise: Supine Piriformis stretch  Lumbar rocking Muscle energy with hip flexion/contralateral extension 3 reps R and L sides Thomas test stretch Prone  On elbows with increased "pinching" sensation in L glut region [employer] with pain at end ranges of motion Core stability with hip extension x 5 reps alternating Bird Dog x 3 reps R and L sides with cues for core engagement Seated Pelvic tilts on physiodisc for anterior/posterior tilts with tactile cues Discussed seating positions for home as pain worsens in sitting positions Hamstring stretch Manual Therapy: STM L lumbar paraspinals, L glut med Joint mobilization P>A sacral grade II-III mobilization, Lower  lumbar PA mobs grade II Trigger Point Dry-Needling  Patient Consent Given: Yes Education handout provided: Previously provided Muscles treated: L glut medius Treatment response/outcome: palpable lengthening   [facility] Adult PT Treatment:                                                DATE: 05/05/22:  Anterior/posterior tilt without pain limits and radicular qymptoms on dynadisc x 10 x 2  Lumbar extension with stretch out strap sucured at door through partial range x 5 x 2  Transverse abdominals 10 sec x 10 reps  Quad stretch 30 sec x 3 with strap  Nerve glide supine x 10  Hamstring stretch supine with strap 30 sec x 3  Piriformis stretch 30 sec x 3 Lt supine travell  Diagonal knee to chest 30 sec x 3 Antirotation blue TB one strip x 10 each side  Sit to stand hinged hip core tight x 10  Hip flexor stretch supine Thomas stretch  Manual Therapy: STM Lt lumbar and posterior hip Lumbar mobs Grade II/III  PA mobs Lt GT Grade II/III  Kinesotaping - 2 strips lumbar spine bilat paraspinals help maintain neutral spine   Trigger Point Dry-Needling  Patient Consent Given: Yes Education handout provided: previously provided Muscles treated: Lt QL; glut min/max/piriformis Electrical stimulation performed: Yes Parameters: mAmp current to pt tolerance  Treatment response/outcome: decreased palpable tightness    Modalities:    Moist heat lumbar/hip and psoas x 10 min    PATIENT EDUCATION:  Education details:HEP Person educated: Patient Education method: Explanation, Demonstration, and Handouts Education comprehension: verbalized understanding, returned demonstration, and needs further education  HOME EXERCISE PROGRAM:  Access Code: [email or web address] URL: [email or web address] Date: 04/28/2022 Prepared by: Gillermo Murdoch  Exercises - Supine Transversus Abdominis Bracing with Pelvic Floor Contraction  - 2 x daily - 7 x weekly - 1 sets - 10 reps - 10sec  hold - Supine Sciatic Nerve  Glide  - 2 x daily - 7 x weekly - 1 sets - 10 reps - Hooklying Hamstring Stretch with Strap  - 2 x daily - 7 x weekly - 1 sets - 3 reps - 30 sec  hold - Supine Piriformis Stretch with Leg Straight  - 2 x daily - 7 x weekly - 1 sets - 3 reps - 30 sec  hold - Prone Quadriceps Stretch with Strap  - 2 x daily - 7 x weekly - 1 sets - 3 reps - 30 sec  hold - Seated Hip Flexor Stretch  - 1 x daily - 7 x weekly - 2 sets - 30 sec hold - Standing Bilateral Low Shoulder Row with Anchored Resistance  - 2 x daily - 7 x weekly - 1-3 sets - 10 reps - 2-3 sec  hold - Shoulder extension with resistance - Neutral  - 1 x daily - 7 x weekly - 1-2 sets - 10 reps - 3-5 sec  hold - Anti-Rotation Lateral Stepping with Press  - 2 x daily - 7 x weekly - 1-2 sets - 10 reps - 2-3 sec  hold - Sit to Stand  - 2 x daily - 7 x weekly - 1 sets - 10 reps - 3-5 sec  hold - Wall Quarter Squat  - 2 x daily - 7 x weekly - 1-2 sets - 10 reps - 5-10 sec  hold - Hip Flexor Stretch at Edge of Bed  - 2 x daily - 7 x weekly - 1 sets - 3 reps - 30 sec  hold  ASSESSMENT:  CLINICAL IMPRESSION: Pain is increased today after being off work this week for holidays. Patient notes pain worse with sitting. PT worked on general mobility to work on improving lumbar ROM. Continued with stabilization activities, joint mobilizations, and STM. Pt feels symptoms improving, but still gets intermittent flaring of symptoms. He has partially met STGs and one LTG. We are continuing to progress to LTGs.   OBJECTIVE IMPAIRMENTS: decreased mobility, decreased ROM, decreased strength, hypomobility, increased fascial restrictions, impaired flexibility, impaired sensation, and pain.   GOALS: Goals reviewed with patient? Yes  SHORT TERM GOALS: Target date: 04/29/2022  Independent with initial HEP Goal status: ACHIEVED  2.  The patient will report pain at rest < or equal to 3/10. Baseline:7/10 Goal status: PARTIALLY MET-- Pt has days of lower pain to 3/10 and  then pain increased again today-- so not consistently met.   LONG TERM GOALS: Target date: 05/27/2022  Independent with final HEP Goal status:IN PROGRESS  2.  The patient will be able to return to full day of work. Baseline: leaves after 4 hours Goal status: ACHIEVED  3. The patient will improve A/ROM lumbar flexion to 75% full range. Baseline: 50% with pain Goal status: IN PROGRESS  4.  The patient will report no pain at rest. Baseline:7/10 Goal status: IN PROGRESS  PLAN:  PT FREQUENCY: 2x/week  PT DURATION: 8 weeks  PLANNED INTERVENTIONS: Therapeutic exercises, Therapeutic activity, Neuromuscular re-education, Balance training, Gait training, Patient/Family education, Self Care, Joint mobilization, Dry Needling, Electrical stimulation, Spinal mobilization, Moist heat, Traction, and Manual therapy.  PLAN FOR NEXT SESSION: Continue HEP progression, STM/DN with DN, stretching and strengthening to tolerance; core stabilization; lifting - assess hp height in  standing with heel lift   Renell Allum, PT 05/11/2022, 8:56 AM

## 2022-05-17 ENCOUNTER — Encounter: Payer: Self-pay | Admitting: Rehabilitative and Restorative Service Providers"

## 2022-05-17 ENCOUNTER — Ambulatory Visit: Payer: BC Managed Care – PPO | Attending: Anesthesiology | Admitting: Rehabilitative and Restorative Service Providers"

## 2022-05-17 DIAGNOSIS — M5459 Other low back pain: Secondary | ICD-10-CM | POA: Insufficient documentation

## 2022-05-17 DIAGNOSIS — R29898 Other symptoms and signs involving the musculoskeletal system: Secondary | ICD-10-CM | POA: Diagnosis not present

## 2022-05-17 NOTE — Therapy (Signed)
OUTPATIENT PHYSICAL THERAPY TREATMENT   Patient Name: Ricardo Mccormick MRN: [id number] DOB:July 03, 1955, 67 y.o., male Today's Date: 05/17/2022   PT End of Session - 05/17/22 0801     Visit Number 14    Number of Visits 16    Date for PT Re-Evaluation 05/29/22    Authorization Type [employer] 30 visits/ year    PT Start Time 0757    PT Stop Time 0845    PT Time Calculation (min) 48 min    Activity Tolerance Patient tolerated treatment well               Past Medical History:  Diagnosis Date   Arthritis of both knees 09/26/2013   Patellofemoral chondromalacia and medial joint line arthritis of the right knee. Posttraumatic arthritis of the left knee    Diabetes mellitus without complication ([place])    HLD (hyperlipidemia)    HTN (hypertension)    Past Surgical History:  Procedure Laterality Date   COLONOSCOPY     KNEE SURGERY Left    1980 &1992   Patient Active Problem List   Diagnosis Date Noted   Arthritis of both knees 09/26/2013    PCP: Diamantina Monks, MD REFERRING PROVIDER: Johnell Comings, MD REFERRING DIAG: Lumbar Radiculopathy Rationale for Evaluation and Treatment: Rehabilitation  THERAPY DIAG:  Other low back pain  Other symptoms and signs involving the musculoskeletal system  ONSET DATE: 03/19/22  SUBJECTIVE:                                                                                                                                                                                           SUBJECTIVE STATEMENT: The patient reports symptoms continue to be good and bad. He has continued pain in Lt lumbar region radiating into L hip and anterior thigh. He has an appt with orthopedist 05/24/22.   PERTINENT HISTORY:  L TKR-- no lateral sensation at knee and lateral lower leg, HTN, prediabetic, HLD  PAIN:  Are you having pain? Yes: NPRS scale: 7-8/10 Pain location: L glut med region to anterior thigh Pain description: achiness, squeezing sensation in  thigh Aggravating factors: sitting Relieving factors: standing, walking, laying on right side Pain is relieved with heating pad.   PRECAUTIONS: None  WEIGHT BEARING RESTRICTIONS: No  FALLS:  Has patient fallen in last 6 months? No  PATIENT GOALS: reduce pain  OBJECTIVE:   PALPATION: Quadratus lumborum L tender to palpation, L glut med and L piriformis painful to palpation  LUMBAR ROM:   AROM eval 05/11/22  Flexion 50% limited Most painful *Painful and limited  Extension 50% limitation 50 % limitation with some pain today  Right lateral flexion 50% limitation   Left lateral flexion 50% limitation   Right rotation 50% limitation Pain referred into groin   Left rotation 50% limitation    (Blank rows = not tested)  LOWER EXTREMITY MMT:    MMT Right eval Left eval  Hip flexion 5/5 5/5  Knee flexion 5/5 5/5  Knee extension 5/5 5/5  Ankle dorsiflexion 5/5 5/5  Ankle plantarflexion 5/5 4/5   (Blank rows = not tested)  LUMBAR SPECIAL TESTS:  Straight leg raise test: Produces pain in L thigh with R SLR Thomas test: produces pain in L posterior hip   [facility] Adult PT Treatment:                   Date: 05/17/22:                               Anterior/posterior tilt without pain limits and radicular qymptoms on dynadisc x 10 x 2  Lumbar extension with stretch out strap sucured at door through partial range x 5 x 2  Transverse abdominals 10 sec x 10 reps  Quad stretch 30 sec x 3 with strap  Nerve glide supine x 10  Hamstring stretch supine with strap 30 sec x 3  Piriformis stretch 30 sec x 3 Lt supine travell  Diagonal knee to chest 30 sec x 3 Antirotation blue TB one strip x 10 each side  Sit to stand hinged hip core tight x 10  Hip flexor stretch supine Thomas stretch  Manual Therapy: STM Lt lumbar and posterior hip Lumbar mobs Grade II/III  PA mobs Lt GT Grade II/III Prone press up with overpressure by PT   Kinesotaping - 2 strips lumbar spine bilat paraspinals help  maintain neutral spine   Trigger Point Dry-Needling  Patient Consent Given: Yes Education handout provided: previously provided Muscles treated: Lt QL; glut min/max/piriformis Electrical stimulation performed: Yes Parameters: mAmp current to pt tolerance  Treatment response/outcome: decreased palpable tightness    Modalities:    Moist heat lumbar/hip and psoas x 10 min   DATE: 05/11/22 Therapeutic Exercise: Supine Piriformis stretch  Lumbar rocking Muscle energy with hip flexion/contralateral extension 3 reps R and L sides Thomas test stretch Prone  On elbows with increased "pinching" sensation in L glut region [employer] with pain at end ranges of motion Core stability with hip extension x 5 reps alternating [employer] x 3 reps R and L sides with cues for core engagement Seated Pelvic tilts on physiodisc for anterior/posterior tilts with tactile cues Discussed seating positions for home as pain worsens in sitting positions Hamstring stretch Manual Therapy: STM L lumbar paraspinals, L glut med Joint mobilization P>A sacral grade II-III mobilization, Lower lumbar PA mobs grade II Trigger Point Dry-Needling  Patient Consent Given: Yes Education handout provided: Previously provided Muscles treated: L glut medius Treatment response/outcome: palpable lengthening    PATIENT EDUCATION:  Education details:HEP Person educated: Patient Education method: Consulting civil engineer, Demonstration, and Handouts Education comprehension: verbalized understanding, returned demonstration, and needs further education  HOME EXERCISE PROGRAM:  Access Code: [email or web address] URL: [email or web address] Date: 04/28/2022 Prepared by: Gillermo Murdoch  Exercises - Supine Transversus Abdominis Bracing with Pelvic Floor Contraction  - 2 x daily - 7 x weekly - 1 sets - 10 reps - 10sec  hold - Supine Sciatic Nerve Glide  - 2 x daily - 7 x weekly - 1 sets - 10 reps - Hooklying Hamstring  Stretch with  Strap  - 2 x daily - 7 x weekly - 1 sets - 3 reps - 30 sec  hold - Supine Piriformis Stretch with Leg Straight  - 2 x daily - 7 x weekly - 1 sets - 3 reps - 30 sec  hold - Prone Quadriceps Stretch with Strap  - 2 x daily - 7 x weekly - 1 sets - 3 reps - 30 sec  hold - Seated Hip Flexor Stretch  - 1 x daily - 7 x weekly - 2 sets - 30 sec hold - Standing Bilateral Low Shoulder Row with Anchored Resistance  - 2 x daily - 7 x weekly - 1-3 sets - 10 reps - 2-3 sec  hold - Shoulder extension with resistance - Neutral  - 1 x daily - 7 x weekly - 1-2 sets - 10 reps - 3-5 sec  hold - Anti-Rotation Lateral Stepping with Press  - 2 x daily - 7 x weekly - 1-2 sets - 10 reps - 2-3 sec  hold - Sit to Stand  - 2 x daily - 7 x weekly - 1 sets - 10 reps - 3-5 sec  hold - Wall Quarter Squat  - 2 x daily - 7 x weekly - 1-2 sets - 10 reps - 5-10 sec  hold - Hip Flexor Stretch at Edge of Bed  - 2 x daily - 7 x weekly - 1 sets - 3 reps - 30 sec  hold  ASSESSMENT:  CLINICAL IMPRESSION: Pain is increased today after being off work this week for holidays. Seems to be better when he is walking more and worse when he is sitting more. PT worked on [employer] through the lumbar spine to help with mobility to work on improving lumbar ROM. Continued with stabilization activities, joint mobilizations, and STM. He has partially met STGs and one LTG. We are continuing to progress to LTGs.   OBJECTIVE IMPAIRMENTS: decreased mobility, decreased ROM, decreased strength, hypomobility, increased fascial restrictions, impaired flexibility, impaired sensation, and pain.   GOALS: Goals reviewed with patient? Yes  SHORT TERM GOALS: Target date: 04/29/2022  Independent with initial HEP Goal status: ACHIEVED  2.  The patient will report pain at rest < or equal to 3/10. Baseline:7/10 Goal status: PARTIALLY MET-- Pt has days of lower pain to 3/10 and then pain increased again today-- so not consistently met.   LONG TERM GOALS: Target  date: 05/27/2022  Independent with final HEP Goal status:IN PROGRESS  2.  The patient will be able to return to full day of work. Baseline: leaves after 4 hours Goal status: ACHIEVED  3. The patient will improve A/ROM lumbar flexion to 75% full range. Baseline: 50% with pain Goal status: IN PROGRESS  4.  The patient will report no pain at rest. Baseline:7/10 Goal status: IN PROGRESS  PLAN:  PT FREQUENCY: 2x/week  PT DURATION: 8 weeks  PLANNED INTERVENTIONS: Therapeutic exercises, Therapeutic activity, Neuromuscular re-education, Balance training, Gait training, Patient/Family education, Self Care, Joint mobilization, Dry Needling, Electrical stimulation, Spinal mobilization, Moist heat, Traction, and Manual therapy.  PLAN FOR NEXT SESSION: Continue HEP progression, STM/DN with DN, stretching and strengthening to tolerance; core stabilization; lifting - assess hp height in standing with heel lift   Vincy Feliz P Jazon Jipson, PT 05/17/2022, 8:03 AM

## 2022-05-19 ENCOUNTER — Ambulatory Visit: Payer: BC Managed Care – PPO | Admitting: Rehabilitative and Restorative Service Providers"

## 2022-05-19 ENCOUNTER — Encounter: Payer: Self-pay | Admitting: Rehabilitative and Restorative Service Providers"

## 2022-05-19 DIAGNOSIS — M5459 Other low back pain: Secondary | ICD-10-CM | POA: Diagnosis not present

## 2022-05-19 DIAGNOSIS — R29898 Other symptoms and signs involving the musculoskeletal system: Secondary | ICD-10-CM

## 2022-05-19 NOTE — Therapy (Signed)
OUTPATIENT PHYSICAL THERAPY TREATMENT   Patient Name: Ricardo Mccormick MRN: [id number] DOB:[birth year], 67 y.o., male Today's Date: 05/19/2022   PT End of Session - 05/19/22 0804     Visit Number 14    Number of Visits 16    Date for PT Re-Evaluation 05/29/22    PT Start Time 0759    PT Stop Time 0848    PT Time Calculation (min) 49 min    Activity Tolerance Patient tolerated treatment well               Past Medical History:  Diagnosis Date   Arthritis of both knees 09/26/2013   Patellofemoral chondromalacia and medial joint line arthritis of the right knee. Posttraumatic arthritis of the left knee    Diabetes mellitus without complication ([place])    HLD (hyperlipidemia)    HTN (hypertension)    Past Surgical History:  Procedure Laterality Date   COLONOSCOPY     KNEE SURGERY Left    1980 &1992   Patient Active Problem List   Diagnosis Date Noted   Arthritis of both knees 09/26/2013    PCP: Diamantina Monks, MD REFERRING PROVIDER: Johnell Comings, MD REFERRING DIAG: Lumbar Radiculopathy Rationale for Evaluation and Treatment: Rehabilitation  THERAPY DIAG:  Other low back pain  Other symptoms and signs involving the musculoskeletal system  ONSET DATE: 03/19/22  SUBJECTIVE:                                                                                                                                                                                           SUBJECTIVE STATEMENT: The patient reports symptoms continue to be come and go. He has continued pain in Lt lumbar region radiating into L hip and anterior thigh. He has an appt with orthopedist 05/24/22.   PERTINENT HISTORY:  L TKR-- no lateral sensation at knee and lateral lower leg, HTN, prediabetic, HLD  PAIN:  Are you having pain? Yes: NPRS scale: 7/10 Pain location: L glut med region to anterior thigh Pain description: achiness, squeezing sensation in thigh Aggravating factors: sitting Relieving factors:  standing, walking, laying on right side Pain is relieved with heating pad.   PRECAUTIONS: None  WEIGHT BEARING RESTRICTIONS: No  FALLS:  Has patient fallen in last 6 months? No  PATIENT GOALS: reduce pain  OBJECTIVE:   PALPATION: Quadratus lumborum L tender to palpation, L glut med and L piriformis painful to palpation  LUMBAR ROM:   AROM eval 05/11/22  Flexion 50% limited Most painful *Painful and limited  Extension 50% limitation 50 % limitation with some pain today  Right lateral flexion 50% limitation   Left  lateral flexion 50% limitation   Right rotation 50% limitation Pain referred into groin   Left rotation 50% limitation    (Blank rows = not tested)  LOWER EXTREMITY MMT:    MMT Right eval Left eval  Hip flexion 5/5 5/5  Knee flexion 5/5 5/5  Knee extension 5/5 5/5  Ankle dorsiflexion 5/5 5/5  Ankle plantarflexion 5/5 4/5   (Blank rows = not tested)  LUMBAR SPECIAL TESTS:  Straight leg raise test: Produces pain in L thigh with R SLR Thomas test: produces pain in L posterior hip   [facility] Adult PT Treatment:                   Date: 05/19/22:                               Anterior/posterior tilt without pain limits and radicular symptoms on dynadisc x 10 x 2  Lumbar extension with stretch out strap sucured at door through partial range x 5 x 2  Transverse abdominals 10 sec x 10 reps  Quad stretch 30 sec x 3 with strap  Nerve glide supine x 10  Hamstring stretch supine with strap 30 sec x 3  Piriformis stretch 30 sec x 3 Lt supine travell  Diagonal knee to chest 30 sec x 3 Antirotation blue TB one strip x 10 each side  Sit to stand hinged hip core tight x 10  Hip flexor stretch supine Thomas stretch  Manual Therapy: STM Lt lumbar and posterior hip Lumbar mobs Grade II/III  PA mobs Lt GT Grade II/III Prone press up with overpressure by PT  Trial of securing spine with strap at home   Kinesotaping - 2 strips lumbar spine bilat paraspinals help maintain  neutral spine   Trigger Point Dry-Needling  Patient Consent Given: Yes Education handout provided: previously provided Muscles treated: Lt QL; glut min/max/piriformis Electrical stimulation performed: Yes Parameters: mAmp current to pt tolerance  Treatment response/outcome: decreased palpable tightness    Modalities:    Moist heat lumbar/hip and psoas x 10 min   [facility] Adult PT Treatment:                   Date: 05/17/22:                               Anterior/posterior tilt without pain limits and radicular qymptoms on dynadisc x 10 x 2  Lumbar extension with stretch out strap sucured at door through partial range x 5 x 2  Transverse abdominals 10 sec x 10 reps  Quad stretch 30 sec x 3 with strap  Nerve glide supine x 10  Hamstring stretch supine with strap 30 sec x 3  Piriformis stretch 30 sec x 3 Lt supine travell  Diagonal knee to chest 30 sec x 3 Antirotation blue TB one strip x 10 each side  Sit to stand hinged hip core tight x 10  Hip flexor stretch supine Thomas stretch  Manual Therapy: STM Lt lumbar and posterior hip Lumbar mobs Grade II/III  PA mobs Lt GT Grade II/III Prone press up with overpressure by PT   Kinesotaping - 2 strips lumbar spine bilat paraspinals help maintain neutral spine   Trigger Point Dry-Needling  Patient Consent Given: Yes Education handout provided: previously provided Muscles treated: Lt QL; glut min/max/piriformis Electrical stimulation performed: Yes Parameters: mAmp current to  pt tolerance  Treatment response/outcome: decreased palpable tightness    Modalities:    Moist heat lumbar/hip and psoas x 10 min     PATIENT EDUCATION:  Education details:HEP Person educated: Patient Education method: Explanation, Demonstration, and Handouts Education comprehension: verbalized understanding, returned demonstration, and needs further education  HOME EXERCISE PROGRAM:  Access Code: [email or web address] URL: [email or web address] Date:  04/28/2022 Prepared by: Gillermo Murdoch  Exercises - Supine Transversus Abdominis Bracing with Pelvic Floor Contraction  - 2 x daily - 7 x weekly - 1 sets - 10 reps - 10sec  hold - Supine Sciatic Nerve Glide  - 2 x daily - 7 x weekly - 1 sets - 10 reps - Hooklying Hamstring Stretch with Strap  - 2 x daily - 7 x weekly - 1 sets - 3 reps - 30 sec  hold - Supine Piriformis Stretch with Leg Straight  - 2 x daily - 7 x weekly - 1 sets - 3 reps - 30 sec  hold - Prone Quadriceps Stretch with Strap  - 2 x daily - 7 x weekly - 1 sets - 3 reps - 30 sec  hold - Seated Hip Flexor Stretch  - 1 x daily - 7 x weekly - 2 sets - 30 sec hold - Standing Bilateral Low Shoulder Row with Anchored Resistance  - 2 x daily - 7 x weekly - 1-3 sets - 10 reps - 2-3 sec  hold - Shoulder extension with resistance - Neutral  - 1 x daily - 7 x weekly - 1-2 sets - 10 reps - 3-5 sec  hold - Anti-Rotation Lateral Stepping with Press  - 2 x daily - 7 x weekly - 1-2 sets - 10 reps - 2-3 sec  hold - Sit to Stand  - 2 x daily - 7 x weekly - 1 sets - 10 reps - 3-5 sec  hold - Wall Quarter Squat  - 2 x daily - 7 x weekly - 1-2 sets - 10 reps - 5-10 sec  hold - Hip Flexor Stretch at Edge of Bed  - 2 x daily - 7 x weekly - 1 sets - 3 reps - 30 sec  hold  ASSESSMENT:  CLINICAL IMPRESSION: Pain is increased on an intermittent. Seems to be better when he is walking more and worse when he is sitting more. Continued PA mobs through the lumbar spine to help with mobility to work on improving lumbar ROM with good results. Continued with stabilization activities, joint mobilizations, and STM. He has partially met STGs and one LTG. We are continuing to progress to LTGs.   OBJECTIVE IMPAIRMENTS: decreased mobility, decreased ROM, decreased strength, hypomobility, increased fascial restrictions, impaired flexibility, impaired sensation, and pain.   GOALS: Goals reviewed with patient? Yes  SHORT TERM GOALS: Target date: 04/29/2022  Independent  with initial HEP Goal status: ACHIEVED  2.  The patient will report pain at rest < or equal to 3/10. Baseline:7/10 Goal status: PARTIALLY MET-- Pt has days of lower pain to 3/10 and then pain increased again today-- so not consistently met.   LONG TERM GOALS: Target date: 05/27/2022  Independent with final HEP Goal status:IN PROGRESS  2.  The patient will be able to return to full day of work. Baseline: leaves after 4 hours Goal status: ACHIEVED  3. The patient will improve A/ROM lumbar flexion to 75% full range. Baseline: 50% with pain Goal status: IN PROGRESS  4.  The patient will report no pain at rest. Baseline:7/10  Goal status: IN PROGRESS  PLAN:  PT FREQUENCY: 2x/week  PT DURATION: 8 weeks  PLANNED INTERVENTIONS: Therapeutic exercises, Therapeutic activity, Neuromuscular re-education, Balance training, Gait training, Patient/Family education, Self Care, Joint mobilization, Dry Needling, Electrical stimulation, Spinal mobilization, Moist heat, Traction, and Manual therapy.  PLAN FOR NEXT SESSION: Continue HEP progression, STM/DN with DN, stretching and strengthening to tolerance; core stabilization; lifting - assess hp height in standing with heel lift   Anyjah Roundtree P Petrice Beedy, PT 05/19/2022, 8:05 AM

## 2022-05-23 ENCOUNTER — Ambulatory Visit: Payer: BC Managed Care – PPO | Admitting: Rehabilitative and Restorative Service Providers"

## 2022-05-24 DIAGNOSIS — M47816 Spondylosis without myelopathy or radiculopathy, lumbar region: Secondary | ICD-10-CM | POA: Diagnosis not present

## 2022-05-24 DIAGNOSIS — M5416 Radiculopathy, lumbar region: Secondary | ICD-10-CM | POA: Diagnosis not present

## 2022-05-26 ENCOUNTER — Ambulatory Visit: Payer: BC Managed Care – PPO | Admitting: Rehabilitative and Restorative Service Providers"

## 2022-05-26 ENCOUNTER — Encounter: Payer: Self-pay | Admitting: Rehabilitative and Restorative Service Providers"

## 2022-05-26 DIAGNOSIS — R29898 Other symptoms and signs involving the musculoskeletal system: Secondary | ICD-10-CM

## 2022-05-26 DIAGNOSIS — M5459 Other low back pain: Secondary | ICD-10-CM | POA: Diagnosis not present

## 2022-05-26 NOTE — Therapy (Signed)
OUTPATIENT PHYSICAL THERAPY TREATMENT   Patient Name: Ricardo Mccormick MRN: [id number] DOB:11-Jan-1956, 67 y.o., male Today's Date: 05/26/2022   PT End of Session - 05/26/22 0809     Visit Number 15    Number of Visits 16    Date for PT Re-Evaluation 05/29/22    Authorization Type [employer] 30 visits/ year    PT Start Time 0758    PT Stop Time 0846    PT Time Calculation (min) 48 min               Past Medical History:  Diagnosis Date   Arthritis of both knees 09/26/2013   Patellofemoral chondromalacia and medial joint line arthritis of the right knee. Posttraumatic arthritis of the left knee    Diabetes mellitus without complication ([facility])    HLD (hyperlipidemia)    HTN (hypertension)    Past Surgical History:  Procedure Laterality Date   COLONOSCOPY     KNEE SURGERY Left    1980 &1992   Patient Active Problem List   Diagnosis Date Noted   Arthritis of both knees 09/26/2013    PCP: Valetta Close, MD REFERRING PROVIDER: Ceasar Lund, MD REFERRING DIAG: Lumbar Radiculopathy Rationale for Evaluation and Treatment: Rehabilitation  THERAPY DIAG:  Other low back pain  Other symptoms and signs involving the musculoskeletal system  ONSET DATE: 03/19/22  SUBJECTIVE:                                                                                                                                                                                           SUBJECTIVE STATEMENT: The patient reports that he saw Dr Ignacia Palma this week and will have MRI 06/05/22 and RTD 06/13/22 for followup. Symptoms continue to be come and go. He has continued pain in Lt lumbar region radiating into L hip and anterior thigh and into foot. Has had some cramping in the leg last night. Marland Kitchen    PERTINENT HISTORY:  L TKR-- no lateral sensation at knee and lateral lower leg, HTN, prediabetic, HLD  PAIN:  Are you having pain? Yes: NPRS scale: 3-4/10 Pain location: L glut med region to anterior thigh Pain  description: achiness, squeezing sensation in thigh Aggravating factors: sitting Relieving factors: standing, walking, laying on right side Pain is relieved with heating pad.   PRECAUTIONS: None  WEIGHT BEARING RESTRICTIONS: No  FALLS:  Has patient fallen in last 6 months? No  PATIENT GOALS: reduce pain  OBJECTIVE:   PALPATION: 05/25/22: tightness Lt quadratus lumborum; glut med; piriformis    LUMBAR ROM:   AROM eval 05/11/22 05/26/22  Flexion 50% limited Most painful *Painful and limited  Painful and limited  Extension 50% limitation 50 % limitation with some pain today 50% with some central pain  Right lateral flexion 50% limitation    Left lateral flexion 50% limitation    Right rotation 50% limitation Pain referred into groin    Left rotation 50% limitation     (Blank rows = not tested)  LOWER EXTREMITY MMT:    MMT Right eval Left eval  Hip flexion 5/5 5/5  Knee flexion 5/5 5/5  Knee extension 5/5 5/5  Ankle dorsiflexion 5/5 5/5  Ankle plantarflexion 5/5 4/5   (Blank rows = not tested)  LUMBAR SPECIAL TESTS:  Straight leg raise test: Produces pain in L thigh with R SLR Thomas test: produces pain in L posterior hip   [facility] Adult PT Treatment:                   Date: 05/26/22:                               Anterior/posterior tilt without pain limits and radicular symptoms on dynadisc x 10 x 2  Lumbar stabilization on red swiss ball - A/P pelvic tilt; shoulder flexion x 10 each Lumbar extension with stretch out strap sucured at door through partial range x 5 x 2  Transverse abdominals 10 sec x 10 reps  Quad stretch 30 sec x 1 PT assist Sitting ball btn knees green TB at ankles IR bilat hips x 10  Manual Therapy: STM Lt lumbar and posterior hip Lumbar mobs Grade II/III  PA mobs Lt GT Grade II/III Prone press up with overpressure by PT   Trigger Point Dry-Needling  Patient Consent Given: Yes Education handout provided: previously provided Muscles treated: Lt  QL pt Rt sidelying on thin pillow Electrical stimulation performed: Yes Parameters: mAmp current to pt tolerance  Treatment response/outcome: decreased palpable tightness    Modalities:    Moist heat lumbar/hip and psoas x 10 min   [facility] Adult PT Treatment:                   Date: 05/19/22:                               Anterior/posterior tilt without pain limits and radicular symptoms on dynadisc x 10 x 2  Lumbar extension with stretch out strap sucured at door through partial range x 5 x 2  Transverse abdominals 10 sec x 10 reps  Quad stretch 30 sec x 3 with strap  Nerve glide supine x 10  Hamstring stretch supine with strap 30 sec x 3  Piriformis stretch 30 sec x 3 Lt supine travell  Diagonal knee to chest 30 sec x 3 Antirotation blue TB one strip x 10 each side  Sit to stand hinged hip core tight x 10  Hip flexor stretch supine Thomas stretch  Manual Therapy: STM Lt lumbar and posterior hip Lumbar mobs Grade II/III  PA mobs Lt GT Grade II/III Prone press up with overpressure by PT  Trial of securing spine with strap at home   Kinesotaping - 2 strips lumbar spine bilat paraspinals help maintain neutral spine   Trigger Point Dry-Needling  Patient Consent Given: Yes Education handout provided: previously provided Muscles treated: Lt QL; glut min/max/piriformis Electrical stimulation performed: Yes Parameters: mAmp current to pt tolerance  Treatment response/outcome: decreased palpable tightness  Modalities:    Moist heat lumbar/hip and psoas x 10 min    PATIENT EDUCATION:  Education details:HEP Person educated: Patient Education method: Explanation, Demonstration, and Handouts Education comprehension: verbalized understanding, returned demonstration, and needs further education  HOME EXERCISE PROGRAM:  Access Code: [email or web address] URL: [email or web address] Date: 04/28/2022 Prepared by: Gillermo Murdoch  Exercises - Supine Transversus Abdominis Bracing with  Pelvic Floor Contraction  - 2 x daily - 7 x weekly - 1 sets - 10 reps - 10sec  hold - Supine Sciatic Nerve Glide  - 2 x daily - 7 x weekly - 1 sets - 10 reps - Hooklying Hamstring Stretch with Strap  - 2 x daily - 7 x weekly - 1 sets - 3 reps - 30 sec  hold - Supine Piriformis Stretch with Leg Straight  - 2 x daily - 7 x weekly - 1 sets - 3 reps - 30 sec  hold - Prone Quadriceps Stretch with Strap  - 2 x daily - 7 x weekly - 1 sets - 3 reps - 30 sec  hold - Seated Hip Flexor Stretch  - 1 x daily - 7 x weekly - 2 sets - 30 sec hold - Standing Bilateral Low Shoulder Row with Anchored Resistance  - 2 x daily - 7 x weekly - 1-3 sets - 10 reps - 2-3 sec  hold - Shoulder extension with resistance - Neutral  - 1 x daily - 7 x weekly - 1-2 sets - 10 reps - 3-5 sec  hold - Anti-Rotation Lateral Stepping with Press  - 2 x daily - 7 x weekly - 1-2 sets - 10 reps - 2-3 sec  hold - Sit to Stand  - 2 x daily - 7 x weekly - 1 sets - 10 reps - 3-5 sec  hold - Wall Quarter Squat  - 2 x daily - 7 x weekly - 1-2 sets - 10 reps - 5-10 sec  hold - Hip Flexor Stretch at Edge of Bed  - 2 x daily - 7 x weekly - 1 sets - 3 reps - 30 sec  hold  ASSESSMENT:  CLINICAL IMPRESSION: Patient reports continued pain intermittent basis. Symptoms are better when he is walking more and worse when he is sitting more. Trial of DN to QL with pt Rt sidelying. Added hip IR with resistance sitting. Continued PA mobs through the lumbar spine to help with mobility to work on improving lumbar ROM with good results. Patient reports being pain free at end of treatment. Will continue with therapy pending RTD. Continued with stabilization activities, joint mobilizations, and STM. He has partially met STGs and one LTG. We are continuing to progress to LTGs.   OBJECTIVE IMPAIRMENTS: decreased mobility, decreased ROM, decreased strength, hypomobility, increased fascial restrictions, impaired flexibility, impaired sensation, and pain.    GOALS: Goals reviewed with patient? Yes  SHORT TERM GOALS: Target date: 04/29/2022  Independent with initial HEP Goal status: ACHIEVED  2.  The patient will report pain at rest < or equal to 3/10. Baseline:7/10 Goal status: PARTIALLY MET-- Pt has days of lower pain to 3/10 and then pain increased again today-- so not consistently met.   LONG TERM GOALS: Target date: 05/27/2022  Independent with final HEP Goal status:IN PROGRESS  2.  The patient will be able to return to full day of work. Baseline: leaves after 4 hours Goal status: ACHIEVED  3. The patient will improve A/ROM lumbar flexion to 75% full range. Baseline: 50% with  pain Goal status: IN PROGRESS  4.  The patient will report no pain at rest. Baseline:7/10 Goal status: IN PROGRESS  PLAN:  PT FREQUENCY: 2x/week  PT DURATION: 8 weeks  PLANNED INTERVENTIONS: Therapeutic exercises, Therapeutic activity, Neuromuscular re-education, Balance training, Gait training, Patient/Family education, Self Care, Joint mobilization, Dry Needling, Electrical stimulation, Spinal mobilization, Moist heat, Traction, and Manual therapy.  PLAN FOR NEXT SESSION: Continue HEP progression, STM/DN with DN, stretching and strengthening to tolerance; core stabilization; lifting - assess hp height in standing with heel lift   Pippa Hanif Nilda Simmer, PT 05/26/2022, 8:39 AM

## 2022-06-01 ENCOUNTER — Encounter: Payer: Self-pay | Admitting: Rehabilitative and Restorative Service Providers"

## 2022-06-01 ENCOUNTER — Ambulatory Visit: Payer: BC Managed Care – PPO | Admitting: Rehabilitative and Restorative Service Providers"

## 2022-06-01 DIAGNOSIS — M5459 Other low back pain: Secondary | ICD-10-CM

## 2022-06-01 DIAGNOSIS — L570 Actinic keratosis: Secondary | ICD-10-CM | POA: Diagnosis not present

## 2022-06-01 DIAGNOSIS — L821 Other seborrheic keratosis: Secondary | ICD-10-CM | POA: Diagnosis not present

## 2022-06-01 DIAGNOSIS — L820 Inflamed seborrheic keratosis: Secondary | ICD-10-CM | POA: Diagnosis not present

## 2022-06-01 DIAGNOSIS — R29898 Other symptoms and signs involving the musculoskeletal system: Secondary | ICD-10-CM

## 2022-06-01 DIAGNOSIS — L578 Other skin changes due to chronic exposure to nonionizing radiation: Secondary | ICD-10-CM | POA: Diagnosis not present

## 2022-06-01 NOTE — Therapy (Addendum)
OUTPATIENT PHYSICAL THERAPY TREATMENT AND DISCHARGE SUMMARY   PHYSICAL THERAPY DISCHARGE SUMMARY  Visits from Start of Care: 16  Current functional level related to goals / functional outcomes: See progress note for discharge status    Remaining deficits: Unknown    Education / Equipment: HEP   Patient agrees to discharge. Patient goals were partially met. Patient is being discharged due to  seeing surgeon for further evaluation and treatment. Ricardo Mccormick P. Helene Kelp PT, MPH 08/10/22 7:42 AM  Patient Name: Ricardo Mccormick MRN: [id number] DOB:July 20, 1955, 67 y.o., male Today's Date: 06/01/2022   PT End of Session - 06/01/22 [id number]     Number of Visits 16    Date for PT Re-Evaluation 07/27/22    Authorization Type [employer] 30 visits/ year    PT Start Time 0800    PT Stop Time 0848    PT Time Calculation (min) 48 min    Activity Tolerance Patient tolerated treatment well               Past Medical History:  Diagnosis Date   Arthritis of both knees 09/26/2013   Patellofemoral chondromalacia and medial joint line arthritis of the right knee. Posttraumatic arthritis of the left knee    Diabetes mellitus without complication ([place])    HLD (hyperlipidemia)    HTN (hypertension)    Past Surgical History:  Procedure Laterality Date   COLONOSCOPY     KNEE SURGERY Left    1980 &1992   Patient Active Problem List   Diagnosis Date Noted   Arthritis of both knees 09/26/2013    PCP: Diamantina Monks, MD REFERRING PROVIDER: Johnell Comings, MD REFERRING DIAG: Lumbar Radiculopathy Rationale for Evaluation and Treatment: Rehabilitation  THERAPY DIAG:  Other low back pain  Other symptoms and signs involving the musculoskeletal system  ONSET DATE: 03/19/22  SUBJECTIVE:                                                                                                                                                                                           SUBJECTIVE STATEMENT: The patient  reports that he saw Dr Everrett Coombe this week and will have MRI 06/05/22 and RTD 06/13/22 for followup. Symptoms continue to be come and go. He has continued pain in Lt lumbar region radiating into L hip and anterior thigh and into foot. Has had some cramping in the leg last night. Ricardo Mccormick    PERTINENT HISTORY:  L TKR-- no lateral sensation at knee and lateral lower leg, HTN, prediabetic, HLD  PAIN:  Are you having pain? Yes: NPRS scale: 3-4/10 Pain location: L glut med region to anterior thigh Pain description:  achiness, squeezing sensation in thigh Aggravating factors: sitting Relieving factors: standing, walking, laying on right side Pain is relieved with heating pad.   PRECAUTIONS: None  WEIGHT BEARING RESTRICTIONS: No  FALLS:  Has patient fallen in last 6 months? No  PATIENT GOALS: reduce pain  OBJECTIVE:   PALPATION: 06/01/22: tightness Lt quadratus lumborum; glut med; piriformis    LUMBAR ROM: 06/01/22 - no change in trunk ROM   AROM eval 05/11/22 05/26/22  Flexion 50% limited Most painful *Painful and limited Painful and limited  Extension 50% limitation 50 % limitation with some pain today 50% with some central pain  Right lateral flexion 50% limitation    Left lateral flexion 50% limitation    Right rotation 50% limitation Pain referred into groin    Left rotation 50% limitation     (Blank rows = not tested)  LOWER EXTREMITY MMT:    MMT Right eval Left eval  Hip flexion 5/5 5/5  Knee flexion 5/5 5/5  Knee extension 5/5 5/5  Ankle dorsiflexion 5/5 5/5  Ankle plantarflexion 5/5 4/5   (Blank rows = not tested)  LUMBAR SPECIAL TESTS:  Straight leg raise test: Produces pain in L thigh with R SLR Thomas test: produces pain in L posterior hip   [facility] Adult PT Treatment:                   [facility] Adult PT Treatment:                   Date: 06/01/22:                               Anterior/posterior tilt without pain limits and radicular symptoms on dynadisc x 10 x 2   Transverse abdominals 10 sec x 10 reps  Quad stretch 30 sec x 3 with strap  Nerve glide supine x 10  Piriformis stretch 30 sec x 3 Lt supine travell  Diagonal knee to chest 30 sec x 3 Antirotation blue TB one strip x 10 each side  Manual Therapy: STM Lt lumbar and posterior hip Lumbar mobs Grade II/III  PA mobs Lt GT Grade II/III Prone press up with overpressure by PT  Trial of securing spine with strap at home   Trigger Point Dry-Needling  Patient Consent Given: Yes Education handout provided: previously provided Muscles treated: Lt glut min/max/piriformis Electrical stimulation performed: Yes Parameters: mAmp current to pt tolerance  Microcurrent to pt tolerance  Treatment response/outcome: decreased palpable tightness    Modalities:    Moist heat lumbar/hip and psoas x 10 min   Date: 05/26/22:                               Anterior/posterior tilt without pain limits and radicular symptoms on dynadisc x 10 x 2  Lumbar stabilization on red swiss ball - A/P pelvic tilt; shoulder flexion x 10 each Lumbar extension with stretch out strap sucured at door through partial range x 5 x 2  Transverse abdominals 10 sec x 10 reps  Quad stretch 30 sec x 1 PT assist Sitting ball btn knees green TB at ankles IR bilat hips x 10  Manual Therapy: STM Lt lumbar and posterior hip Lumbar mobs Grade II/III  PA mobs Lt GT Grade II/III Prone press up with overpressure by PT   Trigger Point Dry-Needling  Patient Consent Given: Yes Education  handout provided: previously provided Muscles treated: Lt QL pt Rt sidelying on thin pillow Electrical stimulation performed: Yes Parameters: mAmp current to pt tolerance  Treatment response/outcome: decreased palpable tightness    Modalities:    Moist heat lumbar/hip and psoas x 10 min    PATIENT EDUCATION:  Education details:HEP Person educated: Patient Education method: Explanation, Demonstration, and Handouts Education comprehension: verbalized  understanding, returned demonstration, and needs further education  HOME EXERCISE PROGRAM:  Access Code: [email or web address] URL: [email or web address] Date: 04/28/2022 Prepared by: Gillermo Murdoch  Exercises - Supine Transversus Abdominis Bracing with Pelvic Floor Contraction  - 2 x daily - 7 x weekly - 1 sets - 10 reps - 10sec  hold - Supine Sciatic Nerve Glide  - 2 x daily - 7 x weekly - 1 sets - 10 reps - Hooklying Hamstring Stretch with Strap  - 2 x daily - 7 x weekly - 1 sets - 3 reps - 30 sec  hold - Supine Piriformis Stretch with Leg Straight  - 2 x daily - 7 x weekly - 1 sets - 3 reps - 30 sec  hold - Prone Quadriceps Stretch with Strap  - 2 x daily - 7 x weekly - 1 sets - 3 reps - 30 sec  hold - Seated Hip Flexor Stretch  - 1 x daily - 7 x weekly - 2 sets - 30 sec hold - Standing Bilateral Low Shoulder Row with Anchored Resistance  - 2 x daily - 7 x weekly - 1-3 sets - 10 reps - 2-3 sec  hold - Shoulder extension with resistance - Neutral  - 1 x daily - 7 x weekly - 1-2 sets - 10 reps - 3-5 sec  hold - Anti-Rotation Lateral Stepping with Press  - 2 x daily - 7 x weekly - 1-2 sets - 10 reps - 2-3 sec  hold - Sit to Stand  - 2 x daily - 7 x weekly - 1 sets - 10 reps - 3-5 sec  hold - Wall Quarter Squat  - 2 x daily - 7 x weekly - 1-2 sets - 10 reps - 5-10 sec  hold - Hip Flexor Stretch at Edge of Bed  - 2 x daily - 7 x weekly - 1 sets - 3 reps - 30 sec  hold  ASSESSMENT:  CLINICAL IMPRESSION: Patient reports continued pain intermittent basis. Symptoms are better when he is walking more and worse when he is sitting more. Continued with DN to Lt posterior hip with temporary improvement. Continued PA mobs through the lumbar spine to help with mobility to work on improving lumbar ROM with good results. Continued stretching and core stabilization/strengthening. Awaiting MRI scheduled for Sunday 06/05/22 and RTD 06/13/22. Will continue with therapy pending RTD. He has partially met STGs and  one LTG. We are continuing to progress to LTGs.   OBJECTIVE IMPAIRMENTS: decreased mobility, decreased ROM, decreased strength, hypomobility, increased fascial restrictions, impaired flexibility, impaired sensation, and pain.   GOALS: Goals reviewed with patient? Yes  SHORT TERM GOALS: Target date: 04/29/2022  Independent with initial HEP Goal status: ACHIEVED  2.  The patient will report pain at rest < or equal to 3/10. Baseline:7/10 Goal status: PARTIALLY MET-- Pt has days of lower pain to 3/10 and then pain increased again today-- so not consistently met.   LONG TERM GOALS: Target date: 07/27/2022  Independent with final HEP Goal status:IN PROGRESS  2.  The patient will be able to return to full day of  work. Baseline: leaves after 4 hours Goal status: ACHIEVED  3. The patient will improve A/ROM lumbar flexion to 75% full range. Baseline: 50% with pain Goal status: IN PROGRESS  4.  The patient will report no pain at rest. Baseline:7/10 Goal status: IN PROGRESS  PLAN:  PT FREQUENCY: 2x/week  PT DURATION: 8 weeks  PLANNED INTERVENTIONS: Therapeutic exercises, Therapeutic activity, Neuromuscular re-education, Balance training, Gait training, Patient/Family education, Self Care, Joint mobilization, Dry Needling, Electrical stimulation, Spinal mobilization, Moist heat, Traction, and Manual therapy.  PLAN FOR NEXT SESSION: Continue HEP progression, STM/DN with DN, stretching and strengthening to tolerance; core stabilization; lifting - assess hp height in standing with heel lift   Yuki Brunsman P Shantee Hayne, PT 06/01/2022, 8:47 AM

## 2022-06-07 ENCOUNTER — Encounter: Payer: BC Managed Care – PPO | Admitting: Rehabilitative and Restorative Service Providers"

## 2022-06-07 DIAGNOSIS — M5416 Radiculopathy, lumbar region: Secondary | ICD-10-CM | POA: Diagnosis not present

## 2022-06-13 DIAGNOSIS — M47816 Spondylosis without myelopathy or radiculopathy, lumbar region: Secondary | ICD-10-CM | POA: Diagnosis not present

## 2022-06-13 DIAGNOSIS — M791 Myalgia, unspecified site: Secondary | ICD-10-CM | POA: Diagnosis not present

## 2022-06-13 DIAGNOSIS — M5416 Radiculopathy, lumbar region: Secondary | ICD-10-CM | POA: Diagnosis not present

## 2022-06-22 DIAGNOSIS — M5416 Radiculopathy, lumbar region: Secondary | ICD-10-CM | POA: Diagnosis not present

## 2022-06-29 DIAGNOSIS — E119 Type 2 diabetes mellitus without complications: Secondary | ICD-10-CM | POA: Diagnosis not present

## 2022-06-29 DIAGNOSIS — Z0000 Encounter for general adult medical examination without abnormal findings: Secondary | ICD-10-CM | POA: Diagnosis not present

## 2022-06-29 DIAGNOSIS — E7800 Pure hypercholesterolemia, unspecified: Secondary | ICD-10-CM | POA: Diagnosis not present

## 2022-06-29 DIAGNOSIS — Z125 Encounter for screening for malignant neoplasm of prostate: Secondary | ICD-10-CM | POA: Diagnosis not present

## 2022-06-29 DIAGNOSIS — E0590 Thyrotoxicosis, unspecified without thyrotoxic crisis or storm: Secondary | ICD-10-CM | POA: Diagnosis not present

## 2022-07-01 DIAGNOSIS — E119 Type 2 diabetes mellitus without complications: Secondary | ICD-10-CM | POA: Diagnosis not present

## 2022-07-05 DIAGNOSIS — M5416 Radiculopathy, lumbar region: Secondary | ICD-10-CM | POA: Diagnosis not present

## 2022-07-05 DIAGNOSIS — M47896 Other spondylosis, lumbar region: Secondary | ICD-10-CM | POA: Diagnosis not present

## 2022-09-01 DIAGNOSIS — E7800 Pure hypercholesterolemia, unspecified: Secondary | ICD-10-CM | POA: Diagnosis not present

## 2022-09-01 DIAGNOSIS — E119 Type 2 diabetes mellitus without complications: Secondary | ICD-10-CM | POA: Diagnosis not present

## 2022-09-01 DIAGNOSIS — E0590 Thyrotoxicosis, unspecified without thyrotoxic crisis or storm: Secondary | ICD-10-CM | POA: Diagnosis not present

## 2022-09-01 DIAGNOSIS — I10 Essential (primary) hypertension: Secondary | ICD-10-CM | POA: Diagnosis not present

## 2022-12-16 DIAGNOSIS — M47896 Other spondylosis, lumbar region: Secondary | ICD-10-CM | POA: Diagnosis not present

## 2022-12-16 DIAGNOSIS — M5416 Radiculopathy, lumbar region: Secondary | ICD-10-CM | POA: Diagnosis not present

## 2022-12-27 DIAGNOSIS — M5416 Radiculopathy, lumbar region: Secondary | ICD-10-CM | POA: Diagnosis not present

## 2022-12-29 ENCOUNTER — Other Ambulatory Visit (HOSPITAL_COMMUNITY): Payer: Self-pay | Admitting: Family Medicine

## 2022-12-29 DIAGNOSIS — R1310 Dysphagia, unspecified: Secondary | ICD-10-CM

## 2022-12-29 DIAGNOSIS — E0590 Thyrotoxicosis, unspecified without thyrotoxic crisis or storm: Secondary | ICD-10-CM | POA: Diagnosis not present

## 2022-12-29 DIAGNOSIS — E7800 Pure hypercholesterolemia, unspecified: Secondary | ICD-10-CM | POA: Diagnosis not present

## 2022-12-29 DIAGNOSIS — I10 Essential (primary) hypertension: Secondary | ICD-10-CM | POA: Diagnosis not present

## 2022-12-29 DIAGNOSIS — E119 Type 2 diabetes mellitus without complications: Secondary | ICD-10-CM | POA: Diagnosis not present

## 2022-12-30 DIAGNOSIS — H31013 Macula scars of posterior pole (postinflammatory) (post-traumatic), bilateral: Secondary | ICD-10-CM | POA: Diagnosis not present

## 2022-12-30 DIAGNOSIS — H35373 Puckering of macula, bilateral: Secondary | ICD-10-CM | POA: Diagnosis not present

## 2022-12-30 DIAGNOSIS — H43813 Vitreous degeneration, bilateral: Secondary | ICD-10-CM | POA: Diagnosis not present

## 2022-12-30 DIAGNOSIS — Z8669 Personal history of other diseases of the nervous system and sense organs: Secondary | ICD-10-CM | POA: Diagnosis not present

## 2023-01-09 DIAGNOSIS — M47896 Other spondylosis, lumbar region: Secondary | ICD-10-CM | POA: Diagnosis not present

## 2023-01-09 DIAGNOSIS — M5416 Radiculopathy, lumbar region: Secondary | ICD-10-CM | POA: Diagnosis not present

## 2023-01-19 ENCOUNTER — Ambulatory Visit (HOSPITAL_COMMUNITY)
Admission: RE | Admit: 2023-01-19 | Discharge: 2023-01-19 | Disposition: A | Discharge status: Home / Self Care | Payer: BC Managed Care – PPO | Source: Ambulatory Visit | Attending: Family Medicine | Admitting: Family Medicine

## 2023-01-19 DIAGNOSIS — K449 Diaphragmatic hernia without obstruction or gangrene: Secondary | ICD-10-CM | POA: Diagnosis not present

## 2023-01-19 DIAGNOSIS — K5710 Diverticulosis of small intestine without perforation or abscess without bleeding: Secondary | ICD-10-CM | POA: Diagnosis not present

## 2023-01-19 DIAGNOSIS — R1310 Dysphagia, unspecified: Secondary | ICD-10-CM | POA: Insufficient documentation

## 2023-01-19 DIAGNOSIS — K219 Gastro-esophageal reflux disease without esophagitis: Secondary | ICD-10-CM | POA: Diagnosis not present

## 2023-01-31 DIAGNOSIS — H26492 Other secondary cataract, left eye: Secondary | ICD-10-CM | POA: Diagnosis not present

## 2023-01-31 DIAGNOSIS — H26493 Other secondary cataract, bilateral: Secondary | ICD-10-CM | POA: Diagnosis not present

## 2023-02-07 DIAGNOSIS — M5451 Vertebrogenic low back pain: Secondary | ICD-10-CM | POA: Diagnosis not present

## 2023-02-07 DIAGNOSIS — M5459 Other low back pain: Secondary | ICD-10-CM | POA: Diagnosis not present

## 2023-02-22 DIAGNOSIS — M5459 Other low back pain: Secondary | ICD-10-CM | POA: Diagnosis not present

## 2023-03-02 DIAGNOSIS — I10 Essential (primary) hypertension: Secondary | ICD-10-CM | POA: Diagnosis not present

## 2023-03-02 DIAGNOSIS — E119 Type 2 diabetes mellitus without complications: Secondary | ICD-10-CM | POA: Diagnosis not present

## 2023-03-02 DIAGNOSIS — E7800 Pure hypercholesterolemia, unspecified: Secondary | ICD-10-CM | POA: Diagnosis not present

## 2023-03-02 DIAGNOSIS — E0590 Thyrotoxicosis, unspecified without thyrotoxic crisis or storm: Secondary | ICD-10-CM | POA: Diagnosis not present

## 2023-03-03 DIAGNOSIS — M5451 Vertebrogenic low back pain: Secondary | ICD-10-CM | POA: Diagnosis not present

## 2023-04-12 NOTE — Progress Notes (Signed)
Patient ID: Ricardo Mccormick, male   DOB: 1956-02-13, 67 y.o.   MRN: [id number]     Date:  04/26/2023   ID:  Ricardo Mccormick, DOB February 15, 1956, MRN [id number]  PCP:  Darrow Bussing, MD  Cardiologist:  Eden Emms    History of Present Illness:  67 y.o. history of HTN, DM, HLD and family history premature CAD. Father had MI age 83.     07/10/15 Calcium score 1070  With calcium in all 3 major arteries   Myovue done 07/22/15 normal with no ischemia EF 60%   08/09/16 ETT normal  08/20/19   ETT normal  09/01/20 ETT normal  He is taking aspirin and statin   No chest pain.  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 No beta blocker but HR fine and BP Rx wit hlow dose ARB. He has not had palpitations or High HR. Fatigue with Rx  Past Medical History:  Diagnosis Date   Arthritis of both knees 09/26/2013   Patellofemoral chondromalacia and medial joint line arthritis of the right knee. Posttraumatic arthritis of the left knee    Diabetes mellitus without complication ([facility])    HLD (hyperlipidemia)    HTN (hypertension)     Past Surgical History:  Procedure Laterality Date   COLONOSCOPY     KNEE SURGERY Left    1980 &1992     Current Outpatient Medications  Medication Sig Dispense Refill   Accu-Chek FastClix Lancets MISC Apply 1 each topically as needed.     ACCU-CHEK GUIDE test strip 1 each by Other route  as needed.     aspirin EC 81 MG tablet Take 81 mg by mouth every other day. Swallow whole.     atorvastatin (LIPITOR) 40 MG tablet Take 40 mg by mouth daily.     Blood Glucose Monitoring Suppl (ACCU-CHEK GUIDE) w/Device KIT 1 each by Other route as needed.     lisinopril (PRINIVIL,ZESTRIL) 10 MG tablet Take 5 mg by mouth daily.      methimazole (TAPAZOLE) 5 MG tablet Take 5 mg by mouth daily.     No current facility-administered medications for this visit.    Allergies:   Latex    Social History:  The patient  reports that he has never smoked. He has never used smokeless tobacco. He reports current alcohol use of about 1.0 standard drink of alcohol per week. He reports that he does not use drugs.   Family History:  The patient's family history includes Alzheimer's disease in his mother; Cancer in his mother; Diabetes in his maternal grandfather and sister; Epilepsy in his sister; Heart attack in his father.    ROS:  General:no colds or fevers, no weight changes Skin:no rashes or ulcers HEENT:no blurred vision, no congestion CV:see HPI PUL:see HPI GI:no diarrhea constipation or melena, no indigestion GU:no hematuria, no dysuria MS:no  joint pain, no claudication Neuro:no syncope, no lightheadedness Endo:no diabetes, no thyroid disease  Wt Readings from Last 3 Encounters:  04/26/23 212 lb 12.8 oz (96.5 kg)  03/09/22 214 lb (97.1 kg)  09/04/20 190 lb (86.2 kg)     PHYSICAL EXAM:   Affect appropriate Healthy:  appears stated age HEENT: normal Neck supple with no adenopathy JVP normal no bruits no thyromegaly Lungs clear with no wheezing and good diaphragmatic motion Heart:  S1/S2 no murmur, no rub, gallop or click PMI normal Abdomen: benighn, BS positve, no tenderness, no AAA no bruit.  No HSM or HJR Distal pulses intact with no bruits No edema Neuro non-focal Skin warm and dry No muscular weakness   EKG:    04/26/2023 SR T wave inversions 3,F    Recent  Labs:07/07/15 Na140, K+ 4.3 LFTs were normal Cr 1.29  Bun 16 CPK 109 troponin<0.01  Lipid Panel 04/29/15  tg 127, tCHOL 147 hdl 45  LDL 76  Other studies Reviewed: Additional studies/ records that were reviewed today include: previous office notes. .Calcium Score 2017 myovue 2017 and ETT 2018 ETT 08/20/19    ASSESSMENT AND PLAN:  1. CAD: calcium score 1070 06/2015 with normal myovue March 2017 and normal ETT multiple times most recently  April 2022 continue statin and 81 mg ASA  2. HTN:  Well controlled.  Continue current medications and low sodium Dash type diet.   3. Ortho:  Good result from TKR;s Finished PT for lumbar radiculopathy 4. First Degree:  minor change PR 210 msec yearly ECG  5. GERD: with hiatal hernia low carb diet consider adding protonix per primary 6. HLD:  on statin labs with primary target LDL < 70  7. Hyperthyroidism:  on methimazole TSH with primary    F/U in a year   Charlton Haws

## 2023-04-26 ENCOUNTER — Ambulatory Visit: Payer: BC Managed Care – PPO | Attending: Cardiovascular Disease | Admitting: Cardiovascular Disease

## 2023-04-26 ENCOUNTER — Encounter: Payer: Self-pay | Admitting: Cardiovascular Disease

## 2023-04-26 VITALS — BP 122/82 | HR 88 | Ht 70.0 in | Wt 212.8 lb

## 2023-04-26 DIAGNOSIS — Z09 Encounter for follow-up examination after completed treatment for conditions other than malignant neoplasm: Secondary | ICD-10-CM

## 2023-04-26 DIAGNOSIS — I2510 Atherosclerotic heart disease of native coronary artery without angina pectoris: Secondary | ICD-10-CM

## 2023-04-26 DIAGNOSIS — I10 Essential (primary) hypertension: Secondary | ICD-10-CM

## 2023-04-26 NOTE — Patient Instructions (Signed)

## 2023-06-14 DIAGNOSIS — L821 Other seborrheic keratosis: Secondary | ICD-10-CM | POA: Diagnosis not present

## 2023-06-14 DIAGNOSIS — L820 Inflamed seborrheic keratosis: Secondary | ICD-10-CM | POA: Diagnosis not present

## 2023-06-14 DIAGNOSIS — L578 Other skin changes due to chronic exposure to nonionizing radiation: Secondary | ICD-10-CM | POA: Diagnosis not present

## 2023-06-14 DIAGNOSIS — L570 Actinic keratosis: Secondary | ICD-10-CM | POA: Diagnosis not present

## 2023-06-26 DIAGNOSIS — I10 Essential (primary) hypertension: Secondary | ICD-10-CM | POA: Diagnosis not present

## 2023-06-26 DIAGNOSIS — E7800 Pure hypercholesterolemia, unspecified: Secondary | ICD-10-CM | POA: Diagnosis not present

## 2023-06-26 DIAGNOSIS — E119 Type 2 diabetes mellitus without complications: Secondary | ICD-10-CM | POA: Diagnosis not present

## 2023-06-26 DIAGNOSIS — E0590 Thyrotoxicosis, unspecified without thyrotoxic crisis or storm: Secondary | ICD-10-CM | POA: Diagnosis not present

## 2023-07-07 DIAGNOSIS — I10 Essential (primary) hypertension: Secondary | ICD-10-CM | POA: Diagnosis not present

## 2023-07-07 DIAGNOSIS — Z0000 Encounter for general adult medical examination without abnormal findings: Secondary | ICD-10-CM | POA: Diagnosis not present

## 2023-07-07 DIAGNOSIS — E119 Type 2 diabetes mellitus without complications: Secondary | ICD-10-CM | POA: Diagnosis not present

## 2023-07-07 DIAGNOSIS — E0590 Thyrotoxicosis, unspecified without thyrotoxic crisis or storm: Secondary | ICD-10-CM | POA: Diagnosis not present

## 2023-07-07 DIAGNOSIS — Z125 Encounter for screening for malignant neoplasm of prostate: Secondary | ICD-10-CM | POA: Diagnosis not present

## 2023-07-07 DIAGNOSIS — E7800 Pure hypercholesterolemia, unspecified: Secondary | ICD-10-CM | POA: Diagnosis not present

## 2023-09-04 DIAGNOSIS — S338XXA Sprain of other parts of lumbar spine and pelvis, initial encounter: Secondary | ICD-10-CM | POA: Diagnosis not present

## 2023-09-04 DIAGNOSIS — S233XXA Sprain of ligaments of thoracic spine, initial encounter: Secondary | ICD-10-CM | POA: Diagnosis not present

## 2023-09-04 DIAGNOSIS — M62830 Muscle spasm of back: Secondary | ICD-10-CM | POA: Diagnosis not present

## 2023-09-06 DIAGNOSIS — S233XXA Sprain of ligaments of thoracic spine, initial encounter: Secondary | ICD-10-CM | POA: Diagnosis not present

## 2023-09-06 DIAGNOSIS — M62830 Muscle spasm of back: Secondary | ICD-10-CM | POA: Diagnosis not present

## 2023-09-06 DIAGNOSIS — S338XXA Sprain of other parts of lumbar spine and pelvis, initial encounter: Secondary | ICD-10-CM | POA: Diagnosis not present

## 2023-09-12 DIAGNOSIS — M62830 Muscle spasm of back: Secondary | ICD-10-CM | POA: Diagnosis not present

## 2023-09-12 DIAGNOSIS — S233XXA Sprain of ligaments of thoracic spine, initial encounter: Secondary | ICD-10-CM | POA: Diagnosis not present

## 2023-09-12 DIAGNOSIS — S338XXA Sprain of other parts of lumbar spine and pelvis, initial encounter: Secondary | ICD-10-CM | POA: Diagnosis not present

## 2023-09-14 DIAGNOSIS — S338XXA Sprain of other parts of lumbar spine and pelvis, initial encounter: Secondary | ICD-10-CM | POA: Diagnosis not present

## 2023-09-14 DIAGNOSIS — S233XXA Sprain of ligaments of thoracic spine, initial encounter: Secondary | ICD-10-CM | POA: Diagnosis not present

## 2023-09-14 DIAGNOSIS — M62830 Muscle spasm of back: Secondary | ICD-10-CM | POA: Diagnosis not present

## 2023-09-20 DIAGNOSIS — M62830 Muscle spasm of back: Secondary | ICD-10-CM | POA: Diagnosis not present

## 2023-09-20 DIAGNOSIS — S338XXA Sprain of other parts of lumbar spine and pelvis, initial encounter: Secondary | ICD-10-CM | POA: Diagnosis not present

## 2023-09-20 DIAGNOSIS — S233XXA Sprain of ligaments of thoracic spine, initial encounter: Secondary | ICD-10-CM | POA: Diagnosis not present

## 2023-09-26 DIAGNOSIS — M62830 Muscle spasm of back: Secondary | ICD-10-CM | POA: Diagnosis not present

## 2023-09-26 DIAGNOSIS — S233XXA Sprain of ligaments of thoracic spine, initial encounter: Secondary | ICD-10-CM | POA: Diagnosis not present

## 2023-09-26 DIAGNOSIS — S338XXA Sprain of other parts of lumbar spine and pelvis, initial encounter: Secondary | ICD-10-CM | POA: Diagnosis not present

## 2023-09-28 DIAGNOSIS — S233XXA Sprain of ligaments of thoracic spine, initial encounter: Secondary | ICD-10-CM | POA: Diagnosis not present

## 2023-09-28 DIAGNOSIS — S338XXA Sprain of other parts of lumbar spine and pelvis, initial encounter: Secondary | ICD-10-CM | POA: Diagnosis not present

## 2023-09-28 DIAGNOSIS — M62830 Muscle spasm of back: Secondary | ICD-10-CM | POA: Diagnosis not present

## 2023-10-10 DIAGNOSIS — S233XXA Sprain of ligaments of thoracic spine, initial encounter: Secondary | ICD-10-CM | POA: Diagnosis not present

## 2023-10-10 DIAGNOSIS — S338XXA Sprain of other parts of lumbar spine and pelvis, initial encounter: Secondary | ICD-10-CM | POA: Diagnosis not present

## 2023-10-10 DIAGNOSIS — M62830 Muscle spasm of back: Secondary | ICD-10-CM | POA: Diagnosis not present

## 2023-10-12 DIAGNOSIS — S233XXA Sprain of ligaments of thoracic spine, initial encounter: Secondary | ICD-10-CM | POA: Diagnosis not present

## 2023-10-12 DIAGNOSIS — M62830 Muscle spasm of back: Secondary | ICD-10-CM | POA: Diagnosis not present

## 2023-10-12 DIAGNOSIS — S338XXA Sprain of other parts of lumbar spine and pelvis, initial encounter: Secondary | ICD-10-CM | POA: Diagnosis not present

## 2023-10-18 DIAGNOSIS — M62830 Muscle spasm of back: Secondary | ICD-10-CM | POA: Diagnosis not present

## 2023-10-18 DIAGNOSIS — S338XXA Sprain of other parts of lumbar spine and pelvis, initial encounter: Secondary | ICD-10-CM | POA: Diagnosis not present

## 2023-10-18 DIAGNOSIS — S233XXA Sprain of ligaments of thoracic spine, initial encounter: Secondary | ICD-10-CM | POA: Diagnosis not present

## 2023-10-25 DIAGNOSIS — I10 Essential (primary) hypertension: Secondary | ICD-10-CM | POA: Diagnosis not present

## 2023-10-25 DIAGNOSIS — E0590 Thyrotoxicosis, unspecified without thyrotoxic crisis or storm: Secondary | ICD-10-CM | POA: Diagnosis not present

## 2023-10-25 DIAGNOSIS — E119 Type 2 diabetes mellitus without complications: Secondary | ICD-10-CM | POA: Diagnosis not present

## 2023-10-25 DIAGNOSIS — E7800 Pure hypercholesterolemia, unspecified: Secondary | ICD-10-CM | POA: Diagnosis not present

## 2023-10-30 DIAGNOSIS — M62830 Muscle spasm of back: Secondary | ICD-10-CM | POA: Diagnosis not present

## 2023-10-30 DIAGNOSIS — S338XXA Sprain of other parts of lumbar spine and pelvis, initial encounter: Secondary | ICD-10-CM | POA: Diagnosis not present

## 2023-10-30 DIAGNOSIS — S233XXA Sprain of ligaments of thoracic spine, initial encounter: Secondary | ICD-10-CM | POA: Diagnosis not present

## 2023-11-06 DIAGNOSIS — S338XXA Sprain of other parts of lumbar spine and pelvis, initial encounter: Secondary | ICD-10-CM | POA: Diagnosis not present

## 2023-11-06 DIAGNOSIS — S233XXA Sprain of ligaments of thoracic spine, initial encounter: Secondary | ICD-10-CM | POA: Diagnosis not present

## 2023-11-06 DIAGNOSIS — M62830 Muscle spasm of back: Secondary | ICD-10-CM | POA: Diagnosis not present

## 2023-11-15 DIAGNOSIS — M62830 Muscle spasm of back: Secondary | ICD-10-CM | POA: Diagnosis not present

## 2023-11-15 DIAGNOSIS — S338XXA Sprain of other parts of lumbar spine and pelvis, initial encounter: Secondary | ICD-10-CM | POA: Diagnosis not present

## 2023-11-15 DIAGNOSIS — S233XXA Sprain of ligaments of thoracic spine, initial encounter: Secondary | ICD-10-CM | POA: Diagnosis not present

## 2023-11-27 DIAGNOSIS — S338XXA Sprain of other parts of lumbar spine and pelvis, initial encounter: Secondary | ICD-10-CM | POA: Diagnosis not present

## 2023-11-27 DIAGNOSIS — M62830 Muscle spasm of back: Secondary | ICD-10-CM | POA: Diagnosis not present

## 2023-11-27 DIAGNOSIS — S233XXA Sprain of ligaments of thoracic spine, initial encounter: Secondary | ICD-10-CM | POA: Diagnosis not present

## 2023-12-04 DIAGNOSIS — S338XXA Sprain of other parts of lumbar spine and pelvis, initial encounter: Secondary | ICD-10-CM | POA: Diagnosis not present

## 2023-12-04 DIAGNOSIS — M62830 Muscle spasm of back: Secondary | ICD-10-CM | POA: Diagnosis not present

## 2023-12-04 DIAGNOSIS — S233XXA Sprain of ligaments of thoracic spine, initial encounter: Secondary | ICD-10-CM | POA: Diagnosis not present

## 2023-12-25 DIAGNOSIS — S338XXA Sprain of other parts of lumbar spine and pelvis, initial encounter: Secondary | ICD-10-CM | POA: Diagnosis not present

## 2023-12-25 DIAGNOSIS — S233XXA Sprain of ligaments of thoracic spine, initial encounter: Secondary | ICD-10-CM | POA: Diagnosis not present

## 2023-12-25 DIAGNOSIS — M62830 Muscle spasm of back: Secondary | ICD-10-CM | POA: Diagnosis not present

## 2024-01-08 DIAGNOSIS — S338XXA Sprain of other parts of lumbar spine and pelvis, initial encounter: Secondary | ICD-10-CM | POA: Diagnosis not present

## 2024-01-08 DIAGNOSIS — M62830 Muscle spasm of back: Secondary | ICD-10-CM | POA: Diagnosis not present

## 2024-01-08 DIAGNOSIS — S233XXA Sprain of ligaments of thoracic spine, initial encounter: Secondary | ICD-10-CM | POA: Diagnosis not present

## 2024-01-22 DIAGNOSIS — M62830 Muscle spasm of back: Secondary | ICD-10-CM | POA: Diagnosis not present

## 2024-01-22 DIAGNOSIS — S233XXA Sprain of ligaments of thoracic spine, initial encounter: Secondary | ICD-10-CM | POA: Diagnosis not present

## 2024-01-22 DIAGNOSIS — S338XXA Sprain of other parts of lumbar spine and pelvis, initial encounter: Secondary | ICD-10-CM | POA: Diagnosis not present

## 2024-02-05 DIAGNOSIS — M62830 Muscle spasm of back: Secondary | ICD-10-CM | POA: Diagnosis not present

## 2024-02-05 DIAGNOSIS — S233XXA Sprain of ligaments of thoracic spine, initial encounter: Secondary | ICD-10-CM | POA: Diagnosis not present

## 2024-02-05 DIAGNOSIS — S338XXA Sprain of other parts of lumbar spine and pelvis, initial encounter: Secondary | ICD-10-CM | POA: Diagnosis not present

## 2024-02-14 DIAGNOSIS — E782 Mixed hyperlipidemia: Secondary | ICD-10-CM | POA: Diagnosis not present

## 2024-02-14 DIAGNOSIS — E119 Type 2 diabetes mellitus without complications: Secondary | ICD-10-CM | POA: Diagnosis not present

## 2024-02-14 DIAGNOSIS — I10 Essential (primary) hypertension: Secondary | ICD-10-CM | POA: Diagnosis not present

## 2024-02-14 DIAGNOSIS — E0590 Thyrotoxicosis, unspecified without thyrotoxic crisis or storm: Secondary | ICD-10-CM | POA: Diagnosis not present

## 2024-02-26 DIAGNOSIS — M62830 Muscle spasm of back: Secondary | ICD-10-CM | POA: Diagnosis not present

## 2024-02-26 DIAGNOSIS — S338XXA Sprain of other parts of lumbar spine and pelvis, initial encounter: Secondary | ICD-10-CM | POA: Diagnosis not present

## 2024-02-26 DIAGNOSIS — S233XXA Sprain of ligaments of thoracic spine, initial encounter: Secondary | ICD-10-CM | POA: Diagnosis not present

## 2024-03-18 DIAGNOSIS — M62830 Muscle spasm of back: Secondary | ICD-10-CM | POA: Diagnosis not present

## 2024-03-18 DIAGNOSIS — S338XXA Sprain of other parts of lumbar spine and pelvis, initial encounter: Secondary | ICD-10-CM | POA: Diagnosis not present

## 2024-03-18 DIAGNOSIS — S233XXA Sprain of ligaments of thoracic spine, initial encounter: Secondary | ICD-10-CM | POA: Diagnosis not present

## 2024-04-08 DIAGNOSIS — M62830 Muscle spasm of back: Secondary | ICD-10-CM | POA: Diagnosis not present

## 2024-04-08 DIAGNOSIS — S338XXA Sprain of other parts of lumbar spine and pelvis, initial encounter: Secondary | ICD-10-CM | POA: Diagnosis not present

## 2024-04-08 DIAGNOSIS — S233XXA Sprain of ligaments of thoracic spine, initial encounter: Secondary | ICD-10-CM | POA: Diagnosis not present

## 2024-04-22 DIAGNOSIS — S233XXA Sprain of ligaments of thoracic spine, initial encounter: Secondary | ICD-10-CM | POA: Diagnosis not present

## 2024-04-22 DIAGNOSIS — M62830 Muscle spasm of back: Secondary | ICD-10-CM | POA: Diagnosis not present

## 2024-04-22 DIAGNOSIS — S338XXA Sprain of other parts of lumbar spine and pelvis, initial encounter: Secondary | ICD-10-CM | POA: Diagnosis not present
